# Patient Record
Sex: Male | Born: 1950 | Race: White | Hispanic: No | Marital: Married | Smoking: Former smoker
Health system: Southern US, Community
[De-identification: ages and names within clinical notes are randomized; demographics above are authoritative.]

## PROBLEM LIST (undated history)

## (undated) DIAGNOSIS — J45909 Unspecified asthma, uncomplicated: Secondary | ICD-10-CM

## (undated) DIAGNOSIS — J309 Allergic rhinitis, unspecified: Secondary | ICD-10-CM

## (undated) DIAGNOSIS — K219 Gastro-esophageal reflux disease without esophagitis: Secondary | ICD-10-CM

## (undated) DIAGNOSIS — I4819 Other persistent atrial fibrillation: Secondary | ICD-10-CM

## (undated) HISTORY — PX: TONSILLECTOMY: SUR1361

## (undated) HISTORY — PX: HERNIA REPAIR: SHX51

## (undated) HISTORY — DX: Other persistent atrial fibrillation: I48.19

## (undated) HISTORY — PX: SINOSCOPY: SHX187

## (undated) HISTORY — PX: ADENOIDECTOMY: SUR15

## (undated) HISTORY — DX: Unspecified asthma, uncomplicated: J45.909

## (undated) HISTORY — DX: Allergic rhinitis, unspecified: J30.9

## (undated) HISTORY — PX: COLON RESECTION: SHX5231

## (undated) HISTORY — DX: Gastro-esophageal reflux disease without esophagitis: K21.9

---

## 2015-05-19 ENCOUNTER — Other Ambulatory Visit: Payer: Self-pay | Admitting: *Deleted

## 2015-05-19 MED ORDER — BUDESONIDE-FORMOTEROL FUMARATE 160-4.5 MCG/ACT IN AERO: 2.0000 | INHALATION_SPRAY | Freq: Two times a day (BID) | RESPIRATORY_TRACT | Status: DC

## 2015-07-05 ENCOUNTER — Encounter: Payer: Self-pay | Admitting: Allergy and Immunology

## 2015-07-05 ENCOUNTER — Ambulatory Visit (INDEPENDENT_AMBULATORY_CARE_PROVIDER_SITE_OTHER): Payer: PRIVATE HEALTH INSURANCE | Admitting: Allergy and Immunology

## 2015-07-05 VITALS — BP 124/86 | HR 86 | Resp 20 | Ht 69.0 in | Wt 187.0 lb

## 2015-07-05 DIAGNOSIS — K219 Gastro-esophageal reflux disease without esophagitis: Secondary | ICD-10-CM

## 2015-07-05 DIAGNOSIS — J309 Allergic rhinitis, unspecified: Secondary | ICD-10-CM

## 2015-07-05 DIAGNOSIS — J387 Other diseases of larynx: Secondary | ICD-10-CM | POA: Diagnosis not present

## 2015-07-05 DIAGNOSIS — H1010 Acute atopic conjunctivitis, unspecified eye: Secondary | ICD-10-CM

## 2015-07-05 DIAGNOSIS — J019 Acute sinusitis, unspecified: Secondary | ICD-10-CM

## 2015-07-05 DIAGNOSIS — J4540 Moderate persistent asthma, uncomplicated: Secondary | ICD-10-CM

## 2015-07-05 MED ORDER — AMOXICILLIN-POT CLAVULANATE 875-125 MG PO TABS: 1.0000 | ORAL_TABLET | Freq: Two times a day (BID) | ORAL | Status: DC

## 2015-07-05 MED ORDER — BUDESONIDE-FORMOTEROL FUMARATE 160-4.5 MCG/ACT IN AERO: 2.0000 | INHALATION_SPRAY | Freq: Two times a day (BID) | RESPIRATORY_TRACT | Status: DC

## 2015-07-05 NOTE — Patient Instructions (Addendum)
  1. Augmentin 875 one tablet twice a day for a full 20 days  2. Continue Symbicort 160 2 inhalations twice a day   3. Continue Accolate 20 mg twice a day  4. Continue Nasonex one spray each nostril one-2 times a day  5. Continue omeprazole 20 mg daily  6. If needed: Astelin, Ventolin  7. Further treatment?  8. Return to clinic in 1 year or earlier if problem

## 2015-07-05 NOTE — Progress Notes (Signed)
Follow-up Note  Referring Provider: No ref. provider found Primary Provider: Pcp Not In System Date of Office Visit: 07/05/2015  Subjective:   Ross Robinson. (DOB: 02-18-1951) is a 65 y.o. male [facility] in re-evaluation of the following:  HPI: Ross Robinson returns to this clinic in reevaluation of his asthma and allergic rhinoconjunctivitis and laryngopharyngeal reflux. I've not seen him in his clinic in over one year.  His asthma has been under excellent control while consistently using his Symbicort. He does not use a short acting bronchodilator. He has not required a systemic steroid for an exacerbation of asthma. He doesn't exercise on a regular basis but when mowing the lawn or using a chainsaw he does fine. If he walks up steep hills for a long period in time he sometimes gets winded but his recovery time is minutes.  His upper airway disease has been very good as well. However, most recently he developed an episode of sinusitis treated initially with doxycycline and then subsequent only with a short course of Augmentin for 7 days. Although he is better with his nasal congestion and ear fullness he still has head fullness and pain on his right cheek and ear fullness. He was also given Kenalog without initial doxycycline prescription.  [name] has had excellent control of his reflux while using omeprazole 20 mg daily.    Medication List           azelastine 0.1 % nasal spray  Commonly known as:  ASTELIN  Place 2 sprays into both nostrils 2 (two) times daily. Use in each nostril as directed     budesonide-formoterol 160-4.5 MCG/ACT inhaler  Commonly known as:  SYMBICORT  Inhale 2 puffs into the lungs 2 (two) times daily.     mometasone 50 MCG/ACT nasal spray  Commonly known as:  NASONEX  Place 2 sprays into the nose 2 (two) times daily.     omeprazole 20 MG capsule  Commonly known as:  PRILOSEC  Take 20 mg by mouth  daily.     VALIUM 5 MG tablet  Generic drug:  diazepam  Take 5 mg by mouth every 6 (six) hours as needed for anxiety.     VENTOLIN HFA 108 (90 Base) MCG/ACT inhaler  Generic drug:  albuterol  Inhale 2 puffs into the lungs every 6 (six) hours as needed for wheezing or shortness of breath.     zafirlukast 20 MG tablet  Commonly known as:  ACCOLATE  Take 20 mg by mouth 2 (two) times daily before a meal.        Past Medical History  Diagnosis Date  . Asthma     Past Surgical History  Procedure Laterality Date  . Hernia repair    . Sinoscopy    . Adenoidectomy    . Tonsillectomy      Allergies  Allergen Reactions  . Biaxin [Clarithromycin]   . Ceftin [Cefuroxime Axetil]     Review of systems negative except as noted in HPI / PMHx or noted below:  Review of Systems  Constitutional: Negative.   HENT: Negative.   Eyes: Negative.   Respiratory: Negative.   Cardiovascular: Negative.   Gastrointestinal: Negative.   Genitourinary: Negative.   Musculoskeletal: Negative.   Skin: Negative.   Neurological: Negative.   Endo/Heme/Allergies: Negative.   Psychiatric/Behavioral: Negative.      Objective:   Filed Vitals:   07/05/15 1113  BP: 124/86  Pulse:  86  Resp: 20   Height:  (175.3 cm)  Weight: 187 lb (84.823 kg)   Physical Exam  Constitutional: He is well-developed, well-nourished, and in no distress.  Slight nasal voice  HENT:  Head: Normocephalic.  Right Ear: Tympanic membrane, external ear and ear canal normal.  Left Ear: Tympanic membrane, external ear and ear canal normal.  Nose: Nose normal. No mucosal edema or rhinorrhea.  Mouth/Throat: Uvula is midline, oropharynx is clear and moist and mucous membranes are normal. No oropharyngeal exudate.  Eyes: Conjunctivae are normal.  Neck: Trachea normal. No tracheal tenderness present. No tracheal deviation present. No thyromegaly present.  Cardiovascular: Normal rate, regular rhythm, S1 normal, S2  normal and normal heart sounds.   No murmur heard. Pulmonary/Chest: Breath sounds normal. No stridor. No respiratory distress. He has no wheezes. He has no rales.  Musculoskeletal: He exhibits no edema.  Lymphadenopathy:       Head (right side): No tonsillar adenopathy present.       Head (left side): No tonsillar adenopathy present.    He has no cervical adenopathy.  Neurological: He is alert. Gait normal.  Skin: No rash noted. He is not diaphoretic. No erythema. Nails show no clubbing.  Psychiatric: Mood and affect normal.    Diagnostics:    Spirometry was performed and demonstrated an FEV1 of 3.12 at 93 % of predicted.  The patient had an Asthma Control Test with the following results: ACT Total Score: 22.    Assessment and Plan:   1. Asthma, moderate persistent, well-controlled   2. Allergic rhinoconjunctivitis   3. LPRD (laryngopharyngeal reflux disease)   4. Acute sinusitis, recurrence not specified, unspecified location     1. Augmentin 875 one tablet twice a day for a full 20 days  2. Continue Symbicort 160 2 inhalations twice a day   3. Continue Accolate 20 mg twice a day  4. Continue Nasonex one spray each nostril one-2 times a day  5. Continue omeprazole 20 mg daily  6. If needed: Astelin, Ventolin  7. Further treatment?  8. Return to clinic in 1 year or earlier if problem  Although [name] appears to have pretty good control of his asthma and reflux his head is not doing as well. Given his previous history of chronic sinusitis I am going to treat him with prolonged antibiotic administration using Augmentin for the next 20 days. He will contact me noting his response to this therapy if it is inadequate in alleviating his remaining sinus headache symptoms. I see no need for changing his other medical therapy at this point and we will now see him back in this clinic in 1 year or earlier if there is a problem.  Laurette Schimke, MD [facility]

## 2015-07-07 ENCOUNTER — Telehealth: Payer: Self-pay | Admitting: Allergy and Immunology

## 2015-07-07 ENCOUNTER — Other Ambulatory Visit: Payer: Self-pay | Admitting: *Deleted

## 2015-07-07 MED ORDER — AZELASTINE HCL 0.1 % NA SOLN: NASAL | Status: DC

## 2015-07-07 MED ORDER — ZAFIRLUKAST 20 MG PO TABS: ORAL_TABLET | ORAL | Status: DC

## 2015-07-07 MED ORDER — MOMETASONE FUROATE 50 MCG/ACT NA SUSP: NASAL | Status: DC

## 2015-07-07 NOTE — Telephone Encounter (Signed)
RX SENT TO PHARMACY

## 2015-07-07 NOTE — Telephone Encounter (Signed)
Needs refills on ACCOLATE, NASONEX, ASTELIN.   He was seen on Monday.  [facility]

## 2015-08-05 ENCOUNTER — Telehealth: Payer: Self-pay | Admitting: Allergy and Immunology

## 2015-08-05 ENCOUNTER — Other Ambulatory Visit: Payer: Self-pay | Admitting: *Deleted

## 2015-08-05 MED ORDER — ZAFIRLUKAST 20 MG PO TABS: ORAL_TABLET | ORAL | Status: DC

## 2015-08-05 MED ORDER — BUDESONIDE-FORMOTEROL FUMARATE 160-4.5 MCG/ACT IN AERO: 2.0000 | INHALATION_SPRAY | Freq: Two times a day (BID) | RESPIRATORY_TRACT | Status: DC

## 2015-08-05 MED ORDER — OMEPRAZOLE 20 MG PO CPDR: 20.0000 mg | DELAYED_RELEASE_CAPSULE | Freq: Every day | ORAL | Status: DC

## 2015-08-05 MED ORDER — AZELASTINE HCL 0.1 % NA SOLN: NASAL | Status: DC

## 2015-08-05 MED ORDER — MOMETASONE FUROATE 50 MCG/ACT NA SUSP: NASAL | Status: DC

## 2015-08-05 NOTE — Telephone Encounter (Signed)
Need 90 day refill Tammy took care of this.

## 2015-12-06 ENCOUNTER — Other Ambulatory Visit: Payer: Self-pay

## 2015-12-06 ENCOUNTER — Telehealth: Payer: Self-pay

## 2015-12-06 MED ORDER — PREDNISONE 10 MG PO TABS: ORAL_TABLET | ORAL | 0 refills | Status: DC

## 2015-12-06 MED ORDER — AMOXICILLIN-POT CLAVULANATE 875-125 MG PO TABS: ORAL_TABLET | ORAL | 0 refills | Status: DC

## 2015-12-06 NOTE — Telephone Encounter (Signed)
This weekend patient started with right sided facial pain. His teeth and ear are bothering him as well.  Right side of face is puffy.  He is having some clear nasal drainage. He did take some allergy pills and Chest Congestion PE yesterday.  He is using his medications and wants to know if he should come in to see Dr.Kozlow or if Dr. Lucie Leather can advise him on what he can do to treat his symptoms.  Please advise.

## 2015-12-06 NOTE — Telephone Encounter (Signed)
Please provide patient with Augmentin 875 one tablet twice a day for 14 days and prednisone 10 mg tablet twice a day for 7 days and have him come and see [place] if he is not doing better.

## 2015-12-06 NOTE — Telephone Encounter (Signed)
Called and informed patient of the plan.  ERX sent to [facility].

## 2015-12-13 ENCOUNTER — Ambulatory Visit (INDEPENDENT_AMBULATORY_CARE_PROVIDER_SITE_OTHER): Payer: PRIVATE HEALTH INSURANCE | Admitting: Allergy and Immunology

## 2015-12-13 ENCOUNTER — Encounter: Payer: Self-pay | Admitting: Allergy and Immunology

## 2015-12-13 VITALS — BP 140/82 | HR 68 | Resp 22

## 2015-12-13 DIAGNOSIS — J329 Chronic sinusitis, unspecified: Secondary | ICD-10-CM | POA: Diagnosis not present

## 2015-12-13 DIAGNOSIS — L03213 Periorbital cellulitis: Secondary | ICD-10-CM

## 2015-12-13 DIAGNOSIS — K219 Gastro-esophageal reflux disease without esophagitis: Secondary | ICD-10-CM

## 2015-12-13 DIAGNOSIS — J3089 Other allergic rhinitis: Secondary | ICD-10-CM | POA: Diagnosis not present

## 2015-12-13 DIAGNOSIS — J4540 Moderate persistent asthma, uncomplicated: Secondary | ICD-10-CM | POA: Diagnosis not present

## 2015-12-13 MED ORDER — CLINDAMYCIN HCL 300 MG PO CAPS: 300.0000 mg | ORAL_CAPSULE | Freq: Three times a day (TID) | ORAL | 0 refills | Status: DC

## 2015-12-13 MED ORDER — TRAMADOL HCL 50 MG PO TABS: ORAL_TABLET | ORAL | 0 refills | Status: DC

## 2015-12-13 NOTE — Progress Notes (Signed)
Follow-up Note  Referring Provider: No ref. provider found Primary Provider: Pcp Not In System Date of Office Visit: 12/13/2015  Subjective:   Ross Robinson. (DOB: Nov 17, 1950) is a 65 y.o. male who [facility]2015 in re-evaluation of the following:  HPI: Ross Robinson presents to this clinic in evaluation of right sided face pain and swelling that appeared to develop mid October for which he contacted me by telephone and I gave him Augmentin 875 one tablet twice a day for 14 days and prednisone 20 mg daily for 7 days. He just finished his prednisone yesterday. He is worse. He is now having a lot of swelling around his right eye and is taking ibuprofen because of the pain. He's not really having any ugly nasal discharge or anosmia or fever. He has not had any involvement of his asthma. His reflux is under excellent control. He has had a history of several functional endoscopic sinus surgeries performed on his sinus cavities in the past. His asthma has been stable as has his reflux disease.    Medication List      amoxicillin-clavulanate 875-125 MG tablet Commonly known as:  AUGMENTIN Take one tablet by mouth twice a day for fourteen days.   azelastine 0.1 % nasal spray Commonly known as:  ASTELIN USE TWO SPRAYS IN EACH NOSTRIL TWICE DAILY IF NEEDED   budesonide-formoterol 160-4.5 MCG/ACT inhaler Commonly known as:  SYMBICORT Inhale 2 puffs into the lungs 2 (two) times daily.   mometasone 50 MCG/ACT nasal spray Commonly known as:  NASONEX USE TWO SPRAYS IN EACH NOSTRIL ONCE OR TWICE DAILY   omeprazole 20 MG capsule Commonly known as:  PRILOSEC Take 1 capsule (20 mg total) by mouth daily.   VALIUM 5 MG tablet Generic drug:  diazepam Take 5 mg by mouth every 6 (six) hours as needed for anxiety.   VENTOLIN HFA 108 (90 Base) MCG/ACT inhaler Generic drug:  albuterol Inhale 2 puffs into the lungs every 6 (six) hours as needed for wheezing  or shortness of breath.   zafirlukast 20 MG tablet Commonly known as:  ACCOLATE TAKE ONE TABLET TWICE DAILY       Past Medical History:  Diagnosis Date  . Asthma     Past Surgical History:  Procedure Laterality Date  . ADENOIDECTOMY    . HERNIA REPAIR    . SINOSCOPY    . TONSILLECTOMY      Allergies  Allergen Reactions  . Biaxin [Clarithromycin]   . Ceftin [Cefuroxime Axetil]   . Codeine Nausea And Vomiting    Review of systems negative except as noted in HPI / PMHx or noted below:  Review of Systems  Constitutional: Negative.   HENT: Negative.   Eyes: Negative.   Respiratory: Negative.   Cardiovascular: Negative.   Gastrointestinal: Negative.   Genitourinary: Negative.   Musculoskeletal: Negative.   Skin: Negative.   Neurological: Negative.   Endo/Heme/Allergies: Negative.   Psychiatric/Behavioral: Negative.      Objective:   Vitals:   12/13/15 0916  BP: 140/82  Pulse: 68  Resp: (!) 22          Physical Exam  Constitutional: He is well-developed, well-nourished, and in no distress.  HENT:  Head: Normocephalic.  Right Ear: Tympanic membrane, external ear and ear canal normal.  Left Ear: Tympanic membrane, external ear and ear canal normal.  Nose: Nose normal. No mucosal edema or rhinorrhea.  Mouth/Throat: Uvula is midline, oropharynx  is clear and moist and mucous membranes are normal. No oropharyngeal exudate.  Right periorbital swelling significant infraorbital purple hue without any obvious tenderness.  Eyes: Conjunctivae are normal.  Neck: Trachea normal. No tracheal tenderness present. No tracheal deviation present. No thyromegaly present.  Cardiovascular: Normal rate, regular rhythm, S1 normal, S2 normal and normal heart sounds.   No murmur heard. Pulmonary/Chest: Breath sounds normal. No stridor. No respiratory distress. He has no wheezes. He has no rales.  Musculoskeletal: He exhibits no edema.  Lymphadenopathy:       Head (right side):  No tonsillar adenopathy present.       Head (left side): No tonsillar adenopathy present.    He has no cervical adenopathy.  Neurological: He is alert. Gait normal.  Skin: No rash noted. He is not diaphoretic. No erythema. Nails show no clubbing.  Psychiatric: Mood and affect normal.    Diagnostics:    Spirometry was performed and demonstrated an FEV1 of 2.84 at 84 % of predicted.  The patient had an Asthma Control Test with the following results: ACT Total Score: 25.    Assessment and Plan:   1. Asthma, moderate persistent, well-controlled   2. Other allergic rhinitis   3. LPRD (laryngopharyngeal reflux disease)   4. Chronic sinusitis, unspecified location   5. Periorbital cellulitis of right eye     1. Clindamycin 300 mg tablet 3 times a day for 14 days  2. Continue Symbicort 160 2 inhalations twice a day   3. Continue Accolate 20 mg twice a day  4. Continue Nasonex one spray each nostril one-2 times a day  5. Continue omeprazole 20 mg daily  6. If needed: Astelin, Ventolin  7. Sinus CT scan today in evaluation of possible periorbital cellulitis  8. Can add Tramadol 25mg  one tablet every 6 hours if needed for pain, #20  9. Further evaluation and treatment?  I think we are obligated to rule out periorbital cellulitis and I will obtain a CT scan of Ross Robinson sinuses today and start him on broad-spectrum antibiotics including clindamycin 900 mg daily. If he does have evidence of periorbital cellulitis we'll have him see an ENT physician ASAP. He'll continue on all of his anti-inflammatory medications as noted above and I have given him some tramadol to help with pain.  Laurette SchimkeEric Kozlow, MD [facility]

## 2015-12-13 NOTE — Patient Instructions (Addendum)
  1. Clindamycin 300 mg tablet 3 times a day for 14 days  2. Continue Symbicort 160 2 inhalations twice a day   3. Continue Accolate 20 mg twice a day  4. Continue Nasonex one spray each nostril one-2 times a day  5. Continue omeprazole 20 mg daily  6. If needed: Astelin, Ventolin  7. Sinus CT scan today in evaluation of possible periorbital cellulitis  8. Can add Tramadol 25mg  one tablet every 6 hours if needed for pain, #20  9. Further evaluation and treatment?

## 2015-12-14 ENCOUNTER — Telehealth: Payer: Self-pay | Admitting: Allergy and Immunology

## 2015-12-14 NOTE — Telephone Encounter (Signed)
He has an appt at [facility] at 9:20am on Wednesday.

## 2015-12-14 NOTE — Telephone Encounter (Signed)
Please see if we can get rentals into see Dr. Marcheta Grammes or PA over at [facility] ear nose and throat today or tomorrow and let minerals no that we will attempt to get this done.

## 2015-12-14 NOTE — Telephone Encounter (Signed)
Still having some swelling in face and having a lot of pressure. Has taken 4 doses of medicine that you prescribed yesterday.   Please Advise.

## 2015-12-15 ENCOUNTER — Encounter: Payer: Self-pay | Admitting: Allergy and Immunology

## 2016-05-03 ENCOUNTER — Other Ambulatory Visit: Payer: Self-pay | Admitting: *Deleted

## 2016-05-03 MED ORDER — ZAFIRLUKAST 20 MG PO TABS: ORAL_TABLET | ORAL | 0 refills | Status: DC

## 2016-05-03 MED ORDER — OMEPRAZOLE 20 MG PO CPDR: DELAYED_RELEASE_CAPSULE | ORAL | 0 refills | Status: DC

## 2016-06-29 ENCOUNTER — Ambulatory Visit (INDEPENDENT_AMBULATORY_CARE_PROVIDER_SITE_OTHER): Payer: PRIVATE HEALTH INSURANCE | Admitting: Allergy and Immunology

## 2016-06-29 ENCOUNTER — Encounter: Payer: Self-pay | Admitting: Allergy and Immunology

## 2016-06-29 VITALS — BP 124/90 | HR 72 | Resp 20

## 2016-06-29 DIAGNOSIS — J4540 Moderate persistent asthma, uncomplicated: Secondary | ICD-10-CM

## 2016-06-29 DIAGNOSIS — K219 Gastro-esophageal reflux disease without esophagitis: Secondary | ICD-10-CM

## 2016-06-29 DIAGNOSIS — J3089 Other allergic rhinitis: Secondary | ICD-10-CM

## 2016-06-29 MED ORDER — BUDESONIDE-FORMOTEROL FUMARATE 160-4.5 MCG/ACT IN AERO: 2.0000 | INHALATION_SPRAY | Freq: Two times a day (BID) | RESPIRATORY_TRACT | 3 refills | Status: DC

## 2016-06-29 MED ORDER — AZELASTINE HCL 0.1 % NA SOLN: NASAL | 3 refills | Status: DC

## 2016-06-29 MED ORDER — ZAFIRLUKAST 20 MG PO TABS: ORAL_TABLET | ORAL | 3 refills | Status: DC

## 2016-06-29 MED ORDER — OMEPRAZOLE 20 MG PO CPDR: DELAYED_RELEASE_CAPSULE | ORAL | 3 refills | Status: DC

## 2016-06-29 MED ORDER — MOMETASONE FUROATE 50 MCG/ACT NA SUSP: NASAL | 3 refills | Status: DC

## 2016-06-29 MED ORDER — ALBUTEROL SULFATE HFA 108 (90 BASE) MCG/ACT IN AERS: 2.0000 | INHALATION_SPRAY | Freq: Four times a day (QID) | RESPIRATORY_TRACT | 1 refills | Status: DC | PRN

## 2016-06-29 NOTE — Progress Notes (Signed)
Follow-up Note  Referring Provider: No ref. provider found Primary Provider: Patient, No Pcp Per Date of Office Visit: 06/29/2016  Subjective:   Ross Robinson. (DOB: [birth year]) is a 66 y.o. male who [facility] 06[facility]29/2016 in re-evaluation of the following:  HPI: Ross Robinson returns to this clinic in reevaluation of his asthma and allergic rhinoconjunctivitis and history of chronic sinusitis and reflux-induced respiratory disease. I last saw him in this clinic in October 2017. At that point time he appeared to have a rather significant episode of sinusitis with possible periorbital involvement for which he was treated with high-dose clindamycin.  He did well with that last episode and has not had any sinus problem since. He continues to use Nasonex on a pretty regular basis.  He's had no problems with his asthma requiring him to get a systemic steroid. He rarely uses a short acting bronchodilator and he can exercise without any problem. He continues on Symbicort and Accolate.  Reflux has been under excellent control while consistently using omeprazole.  He did obtain the flu vaccine this year.  Allergies as of 06/29/2016      Reactions   Biaxin [clarithromycin]    Ceftin [cefuroxime Axetil]    Codeine Nausea And Vomiting      Medication List      azelastine 0.1 % nasal spray Commonly known as:  ASTELIN USE TWO SPRAYS IN EACH NOSTRIL TWICE DAILY IF NEEDED   budesonide-formoterol 160-4.5 MCG/ACT inhaler Commonly known as:  SYMBICORT Inhale 2 puffs into the lungs 2 (two) times daily.   mometasone 50 MCG/ACT nasal spray Commonly known as:  NASONEX USE TWO SPRAYS IN EACH NOSTRIL ONCE OR TWICE DAILY   omeprazole 20 MG capsule Commonly known as:  PRILOSEC Take one capsule once daily   VALIUM 5 MG tablet Generic drug:  diazepam Take 5 mg by mouth every 6 (six) hours as needed for anxiety.   VENTOLIN HFA 108 (90 Base) MCG/ACT  inhaler Generic drug:  albuterol Inhale 2 puffs into the lungs every 6 (six) hours as needed for wheezing or shortness of breath.   zafirlukast 20 MG tablet Commonly known as:  ACCOLATE TAKE ONE TABLET TWICE DAILY       Past Medical History:  Diagnosis Date  . Asthma     Past Surgical History:  Procedure Laterality Date  . ADENOIDECTOMY    . HERNIA REPAIR    . SINOSCOPY    . TONSILLECTOMY      Review of systems negative except as noted in HPI / PMHx or noted below:  Review of Systems  Constitutional: Negative.   HENT: Negative.   Eyes: Negative.   Respiratory: Negative.   Cardiovascular: Negative.   Gastrointestinal: Negative.   Genitourinary: Negative.   Musculoskeletal: Negative.   Skin: Negative.   Neurological: Negative.   Endo/Heme/Allergies: Negative.   Psychiatric/Behavioral: Negative.      Objective:   Vitals:   06/29/16 1521  BP: 124/90  Pulse: 72  Resp: 20          Physical Exam  Constitutional: He is well-developed, well-nourished, and in no distress.  HENT:  Head: Normocephalic.  Right Ear: Tympanic membrane, external ear and ear canal normal.  Left Ear: Tympanic membrane, external ear and ear canal normal.  Nose: Nose normal. No mucosal edema or rhinorrhea.  Mouth/Throat: Uvula is midline, oropharynx is clear and moist and mucous membranes are normal. No oropharyngeal exudate.  Eyes: Conjunctivae are  normal.  Neck: Trachea normal. No tracheal tenderness present. No tracheal deviation present. No thyromegaly present.  Cardiovascular: Normal rate, regular rhythm, S1 normal, S2 normal and normal heart sounds.   No murmur heard. Pulmonary/Chest: Breath sounds normal. No stridor. No respiratory distress. He has no wheezes. He has no rales.  Musculoskeletal: He exhibits no edema.  Lymphadenopathy:       Head (right side): No tonsillar adenopathy present.       Head (left side): No tonsillar adenopathy present.    He has no cervical  adenopathy.  Neurological: He is alert. Gait normal.  Skin: No rash noted. He is not diaphoretic. No erythema. Nails show no clubbing.  Psychiatric: Mood and affect normal.    Diagnostics: Results of a sinus CT scan obtained 12/13/2015 identified mucosal thickening of both maxillary sinuses.   Spirometry was performed and demonstrated an FEV1 of 3.05 at 92 % of predicted.  Assessment and Plan:   1. Asthma, moderate persistent, well-controlled   2. Other allergic rhinitis   3. LPRD (laryngopharyngeal reflux disease)     1. Continue Symbicort 160 2 inhalations twice a day   2. Continue Accolate 20 mg twice a day  3. Continue Nasonex one spray each nostril one-2 times a day  4. Continue omeprazole 20 mg daily  5. If needed: Astelin, Ventolin HFA  6.  Return to clinic in 6 months or earlier if problem  Ross Robinson appears to be doing relatively well at this point in time and I will now see him back in this clinic in 6 months or earlier if there is a problem. He will remain on anti-inflammatory agents for both his upper and lower airways and continue to treat reflux as noted above. He will contact me during the interval should there be a problem.  Laurette Schimke, MD Allergy / Immunology [facility]

## 2016-06-29 NOTE — Patient Instructions (Addendum)
  1. Continue Symbicort 160 2 inhalations twice a day   2. Continue Accolate 20 mg twice a day  3. Continue Nasonex one spray each nostril one-2 times a day  4. Continue omeprazole 20 mg daily  5. If needed: Astelin, Ventolin HFA  6.  Return to clinic in 6 months or earlier if problem

## 2016-07-03 ENCOUNTER — Encounter: Payer: Self-pay | Admitting: Allergy and Immunology

## 2016-12-20 ENCOUNTER — Encounter: Payer: Self-pay | Admitting: Allergy and Immunology

## 2016-12-20 ENCOUNTER — Ambulatory Visit (INDEPENDENT_AMBULATORY_CARE_PROVIDER_SITE_OTHER): Payer: PRIVATE HEALTH INSURANCE | Admitting: Allergy and Immunology

## 2016-12-20 VITALS — BP 142/92 | HR 68 | Resp 18

## 2016-12-20 DIAGNOSIS — K219 Gastro-esophageal reflux disease without esophagitis: Secondary | ICD-10-CM | POA: Diagnosis not present

## 2016-12-20 DIAGNOSIS — J3089 Other allergic rhinitis: Secondary | ICD-10-CM | POA: Diagnosis not present

## 2016-12-20 DIAGNOSIS — J4540 Moderate persistent asthma, uncomplicated: Secondary | ICD-10-CM

## 2016-12-20 NOTE — Patient Instructions (Addendum)
  1. Continue Symbicort 160 2 inhalations twice a day   2. Add QVAR 40 Redihaler 2 inhalation two times per day while 'Sick"  3. Continue Accolate 20 mg twice a day  4. Continue Nasonex one spray each nostril one-2 times a day  5. Continue omeprazole 20 mg daily  6. If needed:    C. Astelin   B. Ventolin HFA  C. Mucinex DM  7.  Return to clinic in 6 months or earlier if problem

## 2016-12-20 NOTE — Progress Notes (Signed)
Follow-up Note  Referring Provider: No ref. provider found Primary Provider: Patient, No Pcp Per Date of Office Visit: 12/20/2016  Subjective:   Ross PeterRyan R Goto Jr. (DOB: Jun 15, 1950) is a 66 y.o. male [facility] in re-evaluation of the following:  HPI: Ross RangerReynolds returns to this clinic in reevaluation of his asthma and allergic rhinoconjunctivitis and history of chronic sinusitis and reflux-induced respiratory disease. His last visit to this clinic was 06/29/2016.  Overall he has done wonderful and has not required a systemic steroid or an antibiotic to treat any type of respiratory tract issue and rarely uses any short acting bronchodilator and can exercise without any problem. Likewise, he has had very little issues with his nose or his reflux.  Unfortunately, over the course the past 24 hours he has developed a slight bit of chest congestion and a little bit of cough and just feels tired and run down. He does not have any fever. Some of the material he is coughing up may be slightly tinged in color. He has not been having any issues with his head.  He did receive the flu vaccine.  Allergies as of 12/20/2016      Reactions   Biaxin [clarithromycin]    Ceftin [cefuroxime Axetil]    Codeine Nausea And Vomiting      Medication List      albuterol 108 (90 Base) MCG/ACT inhaler Commonly known as:  VENTOLIN HFA Inhale 2 puffs into the lungs every 6 (six) hours as needed for wheezing or shortness of breath.   azelastine 0.1 % nasal spray Commonly known as:  ASTELIN USE TWO SPRAYS IN EACH NOSTRIL TWICE DAILY IF NEEDED   budesonide-formoterol 160-4.5 MCG/ACT inhaler Commonly known as:  SYMBICORT Inhale 2 puffs into the lungs 2 (two) times daily.   mometasone 50 MCG/ACT nasal spray Commonly known as:  NASONEX USE TWO SPRAYS IN EACH NOSTRIL ONCE OR TWICE DAILY   omeprazole 20 MG capsule Commonly known as:  PRILOSEC Take one  capsule once daily   VALIUM 5 MG tablet Generic drug:  diazepam Take 5 mg by mouth every 6 (six) hours as needed for anxiety.   zafirlukast 20 MG tablet Commonly known as:  ACCOLATE TAKE ONE TABLET TWICE DAILY       Past Medical History:  Diagnosis Date  . Asthma     Past Surgical History:  Procedure Laterality Date  . ADENOIDECTOMY    . HERNIA REPAIR    . SINOSCOPY    . TONSILLECTOMY      Review of systems negative except as noted in HPI / PMHx or noted below:  Review of Systems  Constitutional: Negative.   HENT: Negative.   Eyes: Negative.   Respiratory: Negative.   Cardiovascular: Negative.   Gastrointestinal: Negative.   Genitourinary: Negative.   Musculoskeletal: Negative.   Skin: Negative.   Neurological: Negative.   Endo/Heme/Allergies: Negative.   Psychiatric/Behavioral: Negative.      Objective:   Vitals:   12/20/16 1514  BP: (!) 142/92  Pulse: 68  Resp: 18  SpO2: 95%          Physical Exam  Constitutional: He is well-developed, well-nourished, and in no distress.  HENT:  Head: Normocephalic.  Right Ear: Tympanic membrane, external ear and ear canal normal.  Left Ear: Tympanic membrane, external ear and ear canal normal.  Nose: Nose normal. No mucosal edema or rhinorrhea.  Mouth/Throat: Uvula is midline, oropharynx is clear  and moist and mucous membranes are normal. No oropharyngeal exudate.  Eyes: Conjunctivae are normal.  Neck: Trachea normal. No tracheal tenderness present. No tracheal deviation present. No thyromegaly present.  Cardiovascular: Normal rate, regular rhythm, S1 normal, S2 normal and normal heart sounds.   No murmur heard. Pulmonary/Chest: Breath sounds normal. No stridor. No respiratory distress. He has no wheezes. He has no rales.  Musculoskeletal: He exhibits no edema.  Lymphadenopathy:       Head (right side): No tonsillar adenopathy present.       Head (left side): No tonsillar adenopathy present.    He has no  cervical adenopathy.  Neurological: He is alert. Gait normal.  Skin: No rash noted. He is not diaphoretic. No erythema. Nails show no clubbing.  Psychiatric: Mood and affect normal.    Diagnostics:    Spirometry was performed and demonstrated an FEV1 of 2.76 at 83 % of predicted.  The patient had an Asthma Control Test with the following results: ACT Total Score: 24.    Assessment and Plan:   1. Asthma, moderate persistent, well-controlled   2. Other allergic rhinitis   3. LPRD (laryngopharyngeal reflux disease)     1. Continue Symbicort 160 2 inhalations twice a day   2. Add QVAR 40 Redihaler 2 inhalation two times per day while 'Sick"  3. Continue Accolate 20 mg twice a day  4. Continue Nasonex one spray each nostril one-2 times a day  5. Continue omeprazole 20 mg daily  6. If needed:    C. Astelin   B. Ventolin HFA  C. Mucinex DM  7.  Return to clinic in 6 months or earlier if problem  Ross Robinson appears to be doing relatively well although he may have contracted a viral respiratory tract infection over the course of the past 24 hours and we will treat him conservatively by having him increase his dose of ICS during this timeframe. If he continues to do well I will see him back in this clinic in 6 months. He will contact me during the interval should there be a problem.  Laurette Schimke, MD Allergy / Immunology [facility]

## 2017-02-07 ENCOUNTER — Other Ambulatory Visit: Payer: Self-pay

## 2017-02-07 MED ORDER — ALBUTEROL SULFATE HFA 108 (90 BASE) MCG/ACT IN AERS: 2.0000 | INHALATION_SPRAY | Freq: Four times a day (QID) | RESPIRATORY_TRACT | 1 refills | Status: DC | PRN

## 2017-07-09 ENCOUNTER — Other Ambulatory Visit: Payer: Self-pay | Admitting: *Deleted

## 2017-07-09 MED ORDER — MOMETASONE FUROATE 50 MCG/ACT NA SUSP: NASAL | 0 refills | Status: DC

## 2017-08-02 ENCOUNTER — Ambulatory Visit (INDEPENDENT_AMBULATORY_CARE_PROVIDER_SITE_OTHER): Payer: Medicare Other | Admitting: Allergy and Immunology

## 2017-08-02 ENCOUNTER — Encounter: Payer: Self-pay | Admitting: Allergy and Immunology

## 2017-08-02 ENCOUNTER — Other Ambulatory Visit: Payer: Self-pay | Admitting: *Deleted

## 2017-08-02 VITALS — BP 142/96 | HR 64 | Resp 18

## 2017-08-02 DIAGNOSIS — H9312 Tinnitus, left ear: Secondary | ICD-10-CM

## 2017-08-02 DIAGNOSIS — K648 Other hemorrhoids: Secondary | ICD-10-CM | POA: Diagnosis not present

## 2017-08-02 DIAGNOSIS — I83893 Varicose veins of bilateral lower extremities with other complications: Secondary | ICD-10-CM

## 2017-08-02 DIAGNOSIS — K219 Gastro-esophageal reflux disease without esophagitis: Secondary | ICD-10-CM | POA: Diagnosis not present

## 2017-08-02 DIAGNOSIS — J4540 Moderate persistent asthma, uncomplicated: Secondary | ICD-10-CM | POA: Diagnosis not present

## 2017-08-02 DIAGNOSIS — J3089 Other allergic rhinitis: Secondary | ICD-10-CM

## 2017-08-02 MED ORDER — BECLOMETHASONE DIPROP HFA 40 MCG/ACT IN AERB: INHALATION_SPRAY | RESPIRATORY_TRACT | 3 refills | Status: DC

## 2017-08-02 MED ORDER — OMEPRAZOLE 20 MG PO CPDR: DELAYED_RELEASE_CAPSULE | ORAL | 1 refills | Status: DC

## 2017-08-02 MED ORDER — ZAFIRLUKAST 20 MG PO TABS: ORAL_TABLET | ORAL | 1 refills | Status: DC

## 2017-08-02 MED ORDER — BUDESONIDE-FORMOTEROL FUMARATE 160-4.5 MCG/ACT IN AERO: INHALATION_SPRAY | RESPIRATORY_TRACT | 1 refills | Status: DC

## 2017-08-02 MED ORDER — AZELASTINE HCL 0.1 % NA SOLN: NASAL | 1 refills | Status: DC

## 2017-08-02 MED ORDER — ALBUTEROL SULFATE HFA 108 (90 BASE) MCG/ACT IN AERS: INHALATION_SPRAY | RESPIRATORY_TRACT | 1 refills | Status: DC

## 2017-08-02 MED ORDER — MOMETASONE FUROATE 50 MCG/ACT NA SUSP: NASAL | 1 refills | Status: DC

## 2017-08-02 MED ORDER — ZAFIRLUKAST 20 MG PO TABS: ORAL_TABLET | ORAL | 0 refills | Status: DC

## 2017-08-02 MED ORDER — OMEPRAZOLE 20 MG PO CPDR: DELAYED_RELEASE_CAPSULE | ORAL | 0 refills | Status: DC

## 2017-08-02 MED ORDER — BUDESONIDE-FORMOTEROL FUMARATE 160-4.5 MCG/ACT IN AERO: INHALATION_SPRAY | RESPIRATORY_TRACT | 0 refills | Status: DC

## 2017-08-02 NOTE — Patient Instructions (Addendum)
  1. Continue Symbicort 160 2 inhalations twice a day   2. Add QVAR 40 Redihaler 2 inhalation two times per day while 'Sick"  3. Continue Accolate 20 mg twice a day  4. Continue Nasonex one spray each nostril one-2 times a day  5. Continue omeprazole 20 mg daily  6. If needed:    C. Astelin   B. Ventolin HFA  C. Mucinex DM  7. Dr. Belva Crome for varicose veins, Dr. Logan Bores for hemorrhoids, Dr. Marcheta Grammes for tinnitus  8.  Return to clinic in 6 months or earlier if problem  9.  Obtain fall flu vaccine

## 2017-08-02 NOTE — Progress Notes (Signed)
Follow-up Note  Referring Provider: No ref. provider found Primary Provider: Patient, No Pcp Per Date of Office Visit: 08/02/2017  Subjective:   Ross Robinson. (DOB: Mar 30, 1950) is a 67 y.o. male who [facility] 08[facility]2017 in re-evaluation of the following:  HPI: Ross Robinson returns to this clinic in reevaluation of asthma and allergic rhinoconjunctivitis and history of chronic sinusitis and reflux induced respiratory disease.  His last visit to this clinic was 20 December 2016.  He has not required a systemic steroid or an antibiotic to treat any type of respiratory tract issue.  He can exercise with very little problem and rarely uses a short acting bronchodilator except around the time of exercise.  He continues to use a controller agent on a regular basis including the use of a combination inhaler and a leukotriene modifier and he has not had to activate his action plan which includes adding a higher dose of inhaled steroids.  His reflux is under good control with omeprazole.  He does have several other issues that he mentioned today.  He has had a problem with hemorrhoids that have not responded to conservative therapy..  He had a problem with varicose veins.  He has had a tinnitus manifested as something other than ringing affecting his left ear greater than his right ear for many years.  He has no associated vertigo or hearing loss.  Allergies as of 08/02/2017      Reactions   Biaxin [clarithromycin]    Ceftin [cefuroxime Axetil]    Codeine Nausea And Vomiting      Medication List      albuterol 108 (90 Base) MCG/ACT inhaler Commonly known as:  VENTOLIN HFA Inhale 2 puffs into the lungs every 6 (six) hours as needed for wheezing or shortness of breath.   azelastine 0.1 % nasal spray Commonly known as:  ASTELIN USE TWO SPRAYS IN EACH NOSTRIL TWICE DAILY IF NEEDED   budesonide-formoterol 160-4.5 MCG/ACT inhaler Commonly known as:   SYMBICORT Inhale two puffs twice daily to prevent cough or wheeze. Rinse mouth after use.   mometasone 50 MCG/ACT nasal spray Commonly known as:  NASONEX USE TWO SPRAYS IN EACH NOSTRIL ONCE OR TWICE DAILY   omeprazole 20 MG capsule Commonly known as:  PRILOSEC Take one capsule once daily   VALIUM 5 MG tablet Generic drug:  diazepam Take 5 mg by mouth every 6 (six) hours as needed for anxiety.   zafirlukast 20 MG tablet Commonly known as:  ACCOLATE TAKE ONE TABLET TWICE DAILY       Past Medical History:  Diagnosis Date  . Allergic rhinitis   . Asthma   . LPRD (laryngopharyngeal reflux disease)     Past Surgical History:  Procedure Laterality Date  . ADENOIDECTOMY    . HERNIA REPAIR    . SINOSCOPY    . TONSILLECTOMY      Review of systems negative except as noted in HPI / PMHx or noted below:  Review of Systems  Constitutional: Negative.   HENT: Negative.   Eyes: Negative.   Respiratory: Negative.   Cardiovascular: Negative.   Gastrointestinal: Negative.   Genitourinary: Negative.   Musculoskeletal: Negative.   Skin: Negative.   Neurological: Negative.   Endo/Heme/Allergies: Negative.   Psychiatric/Behavioral: Negative.      Objective:   Vitals:   08/02/17 1544  BP: (!) 142/96  Pulse: 64  Resp: 18  Physical Exam  HENT:  Head: Normocephalic.  Right Ear: Tympanic membrane, external ear and ear canal normal.  Left Ear: Tympanic membrane, external ear and ear canal normal.  Nose: Nose normal. No mucosal edema or rhinorrhea.  Mouth/Throat: Uvula is midline, oropharynx is clear and moist and mucous membranes are normal. No oropharyngeal exudate.  Eyes: Conjunctivae are normal.  Neck: Trachea normal. No tracheal tenderness present. No tracheal deviation present. No thyromegaly present.  Cardiovascular: Normal rate, regular rhythm, S1 normal, S2 normal and normal heart sounds.  No murmur heard. Pulmonary/Chest: No stridor. No respiratory  distress. He has wheezes (End expiratory wheezes posterior lung fields). He has no rales.  Musculoskeletal: He exhibits no edema.  Lymphadenopathy:       Head (right side): No tonsillar adenopathy present.       Head (left side): No tonsillar adenopathy present.    He has no cervical adenopathy.  Neurological: He is alert.  Skin: No rash noted. He is not diaphoretic. No erythema. Nails show no clubbing.    Diagnostics:    Spirometry was performed and demonstrated an FEV1 of 2.84 at 86 % of predicted.  The patient had an Asthma Control Test with the following results: ACT Total Score: 23.    Assessment and Plan:   1. Asthma, moderate persistent, well-controlled   2. Other allergic rhinitis   3. LPRD (laryngopharyngeal reflux disease)   4. Varicose veins of bilateral lower extremities with other complications   5. Other hemorrhoids   6. Tinnitus of left ear     1. Continue Symbicort 160 2 inhalations twice a day   2. Add QVAR 40 Redihaler 2 inhalation two times per day while 'Sick"  3. Continue Accolate 20 mg twice a day  4. Continue Nasonex one spray each nostril one-2 times a day  5. Continue omeprazole 20 mg daily  6. If needed:    C. Astelin   B. Ventolin HFA  C. Mucinex DM  7. Dr. Belva Crome for varicose veins, Dr. Logan Bores for hemorrhoids, Dr. Marcheta Grammes for tinnitus  8.  Return to clinic in 6 months or earlier if problem  9.  Obtain fall flu vaccine  [name] appears to be doing quite well on his current therapy and he will continue to utilize a collection of anti-inflammatory agents for his respiratory tract and therapy directed against reflux and I will see him back in his clinic in 6 months or earlier if there is a problem.  I have given him the names of local doctors who can address his other issues as noted above.  Laurette Schimke, MD Allergy / Immunology [facility]

## 2017-08-06 ENCOUNTER — Encounter: Payer: Self-pay | Admitting: Allergy and Immunology

## 2017-12-20 ENCOUNTER — Other Ambulatory Visit: Payer: Self-pay | Admitting: Allergy and Immunology

## 2018-01-23 ENCOUNTER — Ambulatory Visit (INDEPENDENT_AMBULATORY_CARE_PROVIDER_SITE_OTHER): Payer: Medicare Other | Admitting: Allergy and Immunology

## 2018-01-23 VITALS — BP 122/82 | HR 80 | Resp 16

## 2018-01-23 DIAGNOSIS — J4540 Moderate persistent asthma, uncomplicated: Secondary | ICD-10-CM

## 2018-01-23 DIAGNOSIS — J3089 Other allergic rhinitis: Secondary | ICD-10-CM

## 2018-01-23 DIAGNOSIS — K219 Gastro-esophageal reflux disease without esophagitis: Secondary | ICD-10-CM

## 2018-01-23 MED ORDER — MOMETASONE FUROATE 50 MCG/ACT NA SUSP: NASAL | 1 refills | Status: DC

## 2018-01-23 MED ORDER — BUDESONIDE-FORMOTEROL FUMARATE 160-4.5 MCG/ACT IN AERO: INHALATION_SPRAY | RESPIRATORY_TRACT | 1 refills | Status: DC

## 2018-01-23 MED ORDER — OMEPRAZOLE 20 MG PO CPDR: DELAYED_RELEASE_CAPSULE | ORAL | 1 refills | Status: DC

## 2018-01-23 MED ORDER — ALBUTEROL SULFATE HFA 108 (90 BASE) MCG/ACT IN AERS: INHALATION_SPRAY | RESPIRATORY_TRACT | 1 refills | Status: DC

## 2018-01-23 MED ORDER — ZAFIRLUKAST 20 MG PO TABS: ORAL_TABLET | ORAL | 1 refills | Status: DC

## 2018-01-23 MED ORDER — AZELASTINE HCL 0.1 % NA SOLN: NASAL | 1 refills | Status: DC

## 2018-01-23 NOTE — Progress Notes (Signed)
Follow-up Note  Referring Provider: No ref. provider found Primary Provider: Patient, No Pcp Per Date of Office Visit: 01/23/2018  Subjective:   Ross Robinson. (DOB: 07-21-1950) is a 67 y.o. male who [facility] 01[facility]2018 in re-evaluation of the following:  HPI: Ross Robinson returns to this clinic in reevaluation of asthma and allergic rhinoconjunctivitis and a history of chronic sinusitis and LPR.  His last visit to this clinic was 02 August 2017.  He has really done quite well on his current plan without any significant requirement for short acting bronchodilator and without the use of a systemic steroid or antibiotic to treat any type of respiratory tract issue while consistently using Symbicort and Accolate and Nasonex.  However, about 1 week prior to Thanksgiving he did develop aching and chills and fever and some cough and some green sputum production and went to the urgent care center at [facility] and was treated with doxycycline and prednisone with resolution of this issue.  He still somewhat fatigued but otherwise has resolved the bulk of his respiratory tract symptoms.  His reflux is under very good control at this point in time.  He did obtain a flu vaccine this year.  Allergies as of 01/23/2018      Reactions   Biaxin [clarithromycin]    Ceftin [cefuroxime Axetil]    Codeine Nausea And Vomiting      Medication List      albuterol 108 (90 Base) MCG/ACT inhaler Commonly known as:  PROVENTIL HFA;VENTOLIN HFA Inhale two puffs every four to six hours as needed for cough or wheeze.   azelastine 0.1 % nasal spray Commonly known as:  ASTELIN USE TWO SPRAYS IN EACH NOSTRIL TWICE DAILY IF NEEDED   beclomethasone 40 MCG/ACT inhaler Commonly known as:  QVAR Inhale two doses twice daily during asthma flare-up.  Rinse, gargle, and spit after use.   budesonide-formoterol 160-4.5 MCG/ACT inhaler Commonly known as:  SYMBICORT Inhale two puffs  twice daily to prevent cough or wheeze. Rinse mouth after use.   doxycycline 100 MG capsule Commonly known as:  VIBRAMYCIN   IBUPROFEN PO Take by mouth as needed.   mometasone 50 MCG/ACT nasal spray Commonly known as:  NASONEX USE 1 SPRAY IN EACH NOSTRIL 1 TO 2 TIMES A DAY AS DIRECTED   omeprazole 20 MG capsule Commonly known as:  PRILOSEC Take one capsule once daily   VALIUM 5 MG tablet Generic drug:  diazepam Take 5 mg by mouth every 6 (six) hours as needed for anxiety.   zafirlukast 20 MG tablet Commonly known as:  ACCOLATE TAKE ONE TABLET TWICE DAILY       Past Medical History:  Diagnosis Date  . Allergic rhinitis   . Asthma   . LPRD (laryngopharyngeal reflux disease)     Past Surgical History:  Procedure Laterality Date  . ADENOIDECTOMY    . HERNIA REPAIR    . SINOSCOPY    . TONSILLECTOMY      Review of systems negative except as noted in HPI / PMHx or noted below:  Review of Systems  Constitutional: Negative.   HENT: Negative.   Eyes: Negative.   Respiratory: Negative.   Cardiovascular: Negative.   Gastrointestinal: Negative.   Genitourinary: Negative.   Musculoskeletal: Negative.   Skin: Negative.   Neurological: Negative.   Endo/Heme/Allergies: Negative.   Psychiatric/Behavioral: Negative.      Objective:   Vitals:   01/23/18 1138  BP: 122/82  Pulse: 80  Resp: 16          Physical Exam  HENT:  Head: Normocephalic.  Right Ear: Tympanic membrane, external ear and ear canal normal.  Left Ear: Tympanic membrane, external ear and ear canal normal.  Nose: Nose normal. No mucosal edema or rhinorrhea.  Mouth/Throat: Uvula is midline, oropharynx is clear and moist and mucous membranes are normal. No oropharyngeal exudate.  Eyes: Conjunctivae are normal.  Neck: Trachea normal. No tracheal tenderness present. No tracheal deviation present. No thyromegaly present.  Cardiovascular: Normal rate, regular rhythm, S1 normal, S2 normal and normal  heart sounds.  No murmur heard. Pulmonary/Chest: Breath sounds normal. No stridor. No respiratory distress. He has no wheezes. He has no rales.  Musculoskeletal: He exhibits no edema.  Lymphadenopathy:       Head (right side): No tonsillar adenopathy present.       Head (left side): No tonsillar adenopathy present.    He has no cervical adenopathy.  Neurological: He is alert.  Skin: No rash noted. He is not diaphoretic. No erythema. Nails show no clubbing.    Diagnostics:    Spirometry was performed and demonstrated an FEV1 of 2.80 at 85 % of predicted.    Assessment and Plan:   1. Asthma, moderate persistent, well-controlled   2. Other allergic rhinitis   3. LPRD (laryngopharyngeal reflux disease)     1. Continue Symbicort 160 2 inhalations twice a day   2.  Continue QVAR 40 Redihaler 2 inhalation two times per day while 'Sick"  3. Continue Accolate 20 mg twice a day  4. Continue Nasonex one spray each nostril 1-2 times a day  5. Continue omeprazole 20 mg daily  6. If needed:    C. Astelin   B. Ventolin HFA  C. Mucinex DM  7. Return to clinic in 6 months or earlier if problem   Overall Ross Robinson has done relatively well other than the fact that he contracted an infectious disease around Thanksgiving.  That was really his first infection or respiratory flareup in a prolonged period in time.  He will remain on a combination of anti-inflammatory agents for his airway and therapy directed against reflux as noted above and I will see him back in this clinic in 6 months or earlier if there is a problem.  Laurette Schimke, MD Allergy / Immunology [facility]

## 2018-01-23 NOTE — Patient Instructions (Signed)
  1. Continue Symbicort 160 2 inhalations twice a day   2.  Continue QVAR 40 Redihaler 2 inhalation two times per day while 'Sick"  3. Continue Accolate 20 mg twice a day  4. Continue Nasonex one spray each nostril 1-2 times a day  5. Continue omeprazole 20 mg daily  6. If needed:    C. Astelin   B. Ventolin HFA  C. Mucinex DM  7. Return to clinic in 6 months or earlier if problem

## 2018-01-24 ENCOUNTER — Encounter: Payer: Self-pay | Admitting: Allergy and Immunology

## 2018-05-13 ENCOUNTER — Telehealth: Payer: Self-pay

## 2018-05-13 MED ORDER — SYMBICORT 160-4.5 MCG/ACT IN AERO: INHALATION_SPRAY | RESPIRATORY_TRACT | 0 refills | Status: DC

## 2018-05-13 NOTE — Telephone Encounter (Signed)
Patient called to inform us that his prescription for Symbicort 160 would not be covered by insurance unless it was specified to dispense brand name Symbicort. I have sent this in to [facility] as requested to [facility]. Patient will call back with any further questions or concerns.

## 2018-08-05 ENCOUNTER — Other Ambulatory Visit: Payer: Self-pay | Admitting: Allergy and Immunology

## 2018-08-05 NOTE — Telephone Encounter (Signed)
Courtesy refill  

## 2018-09-01 ENCOUNTER — Other Ambulatory Visit: Payer: Self-pay | Admitting: Allergy and Immunology

## 2018-09-04 ENCOUNTER — Other Ambulatory Visit: Payer: Self-pay | Admitting: Allergy and Immunology

## 2018-09-06 ENCOUNTER — Other Ambulatory Visit: Payer: Self-pay | Admitting: *Deleted

## 2018-09-06 MED ORDER — ZAFIRLUKAST 20 MG PO TABS: ORAL_TABLET | ORAL | 0 refills | Status: DC

## 2018-09-06 MED ORDER — MOMETASONE FUROATE 50 MCG/ACT NA SUSP: NASAL | 0 refills | Status: DC

## 2018-09-06 MED ORDER — ALBUTEROL SULFATE HFA 108 (90 BASE) MCG/ACT IN AERS: INHALATION_SPRAY | RESPIRATORY_TRACT | 0 refills | Status: DC

## 2018-09-06 MED ORDER — AZELASTINE HCL 0.1 % NA SOLN: NASAL | 0 refills | Status: DC

## 2018-09-06 MED ORDER — SYMBICORT 160-4.5 MCG/ACT IN AERO: INHALATION_SPRAY | RESPIRATORY_TRACT | 0 refills | Status: DC

## 2018-09-06 MED ORDER — OMEPRAZOLE 20 MG PO CPDR: DELAYED_RELEASE_CAPSULE | ORAL | 0 refills | Status: DC

## 2018-09-11 ENCOUNTER — Other Ambulatory Visit: Payer: Self-pay

## 2018-09-11 ENCOUNTER — Ambulatory Visit (INDEPENDENT_AMBULATORY_CARE_PROVIDER_SITE_OTHER): Payer: Medicare Other | Admitting: Allergy and Immunology

## 2018-09-11 DIAGNOSIS — J3089 Other allergic rhinitis: Secondary | ICD-10-CM | POA: Diagnosis not present

## 2018-09-11 DIAGNOSIS — J4540 Moderate persistent asthma, uncomplicated: Secondary | ICD-10-CM | POA: Diagnosis not present

## 2018-09-11 DIAGNOSIS — K219 Gastro-esophageal reflux disease without esophagitis: Secondary | ICD-10-CM | POA: Diagnosis not present

## 2018-09-11 NOTE — Progress Notes (Signed)
[facility] [place] [facility] [place]   Follow-up Note  Referring Provider: No ref. provider found Primary Provider: Patient, No Pcp Per Date of Office Visit: 09/11/2018  Subjective:   Ross Robinson. (DOB: 1950/08/26) is a 68 y.o. male who returns to the [facility] on 09/11/2018 in re-evaluation of the following:  HPI: This is an E - med visit requested by patient who is located at home.  Ross Robinson is followed in this clinic for asthma and allergic rhinoconjunctivitis and a history of chronic sinusitis and LPR.  His last visit to this clinic was 23 January 2018.  Lungs doing well. No systemic steroids or antibiotics for an airway issue. Using Symbicort 2 times per day. No activation of "Action Plan". Rare use of SABA. Can exercise.  Nose doing well while using Nasonex and astelin  Reflux under control with omeprazole. Careful about caffeine and chocolate.  Allergies as of 09/11/2018      Reactions   Biaxin [clarithromycin]    Ceftin [cefuroxime Axetil]    Codeine Nausea And Vomiting      Medication List    albuterol 108 (90 Base) MCG/ACT inhaler Commonly known as: Ventolin HFA Inhale two puffs every four to six hours as needed for cough or wheeze.   azelastine 0.1 % nasal spray Commonly known as: ASTELIN USE TWO SPRAYS IN EACH NOSTRIL TWICE DAILY IF NEEDED   beclomethasone 40 MCG/ACT inhaler Commonly known as: Qvar RediHaler Inhale two doses twice daily during asthma flare-up.  Rinse, gargle, and spit after use.   IBUPROFEN PO Take by mouth as needed.   mometasone 50 MCG/ACT nasal spray Commonly known as: NASONEX Use one spray in each nostril once or twice daily as directed   omeprazole 20 MG capsule Commonly known as: PRILOSEC Take one capsule once daily   Symbicort 160-4.5 MCG/ACT inhaler Generic drug: budesonide-formoterol Inhale two puffs twice daily to prevent cough or wheeze. Rinse mouth after use.   Valium 5 MG  tablet Generic drug: diazepam Take 5 mg by mouth every 6 (six) hours as needed for anxiety.   zafirlukast 20 MG tablet Commonly known as: ACCOLATE TAKE ONE TABLET TWICE DAILY       Past Medical History:  Diagnosis Date  . Allergic rhinitis   . Asthma   . LPRD (laryngopharyngeal reflux disease)     Past Surgical History:  Procedure Laterality Date  . ADENOIDECTOMY    . HERNIA REPAIR    . SINOSCOPY    . TONSILLECTOMY      Review of systems negative except as noted in HPI / PMHx or noted below:  Review of Systems  Constitutional: Negative.   HENT: Negative.   Eyes: Negative.   Respiratory: Negative.   Cardiovascular: Negative.   Gastrointestinal: Negative.   Genitourinary: Negative.   Musculoskeletal: Negative.   Skin: Negative.   Neurological: Negative.   Endo/Heme/Allergies: Negative.   Psychiatric/Behavioral: Negative.      Objective:   There were no vitals filed for this visit.        Physical Exam-deferred  Diagnostics:   The patient had an Asthma Control Test with the following results: ACT Total Score: 24.    Assessment and Plan:   1. Asthma, moderate persistent, well-controlled   2. Other allergic rhinitis   3. LPRD (laryngopharyngeal reflux disease)     1. Continue Symbicort 160 2 inhalations twice a day   2.  Continue QVAR 40 Redihaler 2 inhalation two  times per day while 'Sick"  3. Continue Accolate 20 mg twice a day  4. Continue Nasonex one spray each nostril 1-2 times a day  5. Continue omeprazole 20 mg daily  6. If needed:    C. Astelin   B. Ventolin HFA  C. Mucinex DM  7. Return to clinic in 6 months or earlier if problem  8. Obtain fall flu vaccine (and COVID vaccine)   Ross Robinson sounds as though he is doing well with his current plan which includes anti-inflammatory medications for his airway and therapy directed at reflux.  He will continue on his plan which includes anti-inflammatory agents for his airway and therapy  directed against reflux and I will see him back in this clinic in 6 months or earlier if there is a problem.  Total patient interaction time 20 minutes.  Laurette Schimke, MD Allergy / Immunology [facility]

## 2018-09-11 NOTE — Patient Instructions (Addendum)
  1. Continue Symbicort 160 2 inhalations twice a day   2.  Continue QVAR 40 Redihaler 2 inhalation two times per day while 'Sick"  3. Continue Accolate 20 mg twice a day  4. Continue Nasonex one spray each nostril 1-2 times a day  5. Continue omeprazole 20 mg daily  6. If needed:    C. Astelin   B. Ventolin HFA  C. Mucinex DM  7. Return to clinic in 6 months or earlier if problem  8. Obtain fall flu vaccine (and COVID vaccine)

## 2018-09-12 ENCOUNTER — Encounter: Payer: Self-pay | Admitting: Allergy and Immunology

## 2018-09-12 NOTE — Progress Notes (Signed)
Patient is in home. Provider is in office.  Start Time: 320 pm End Time: 350 pm

## 2018-12-05 ENCOUNTER — Other Ambulatory Visit: Payer: Self-pay | Admitting: Allergy and Immunology

## 2018-12-12 ENCOUNTER — Other Ambulatory Visit: Payer: Self-pay | Admitting: Allergy and Immunology

## 2019-02-27 ENCOUNTER — Other Ambulatory Visit: Payer: Self-pay | Admitting: Allergy and Immunology

## 2019-03-04 ENCOUNTER — Other Ambulatory Visit: Payer: Self-pay

## 2019-03-04 ENCOUNTER — Other Ambulatory Visit: Payer: Self-pay | Admitting: Allergy and Immunology

## 2019-03-04 NOTE — Telephone Encounter (Signed)
error 

## 2019-04-05 ENCOUNTER — Other Ambulatory Visit: Payer: Self-pay | Admitting: Allergy and Immunology

## 2019-04-21 ENCOUNTER — Other Ambulatory Visit: Payer: Self-pay

## 2019-04-21 ENCOUNTER — Encounter: Payer: Self-pay | Admitting: Family Medicine

## 2019-04-21 ENCOUNTER — Ambulatory Visit (INDEPENDENT_AMBULATORY_CARE_PROVIDER_SITE_OTHER): Payer: Medicare Other | Admitting: Family Medicine

## 2019-04-21 DIAGNOSIS — J4540 Moderate persistent asthma, uncomplicated: Secondary | ICD-10-CM

## 2019-04-21 DIAGNOSIS — J3089 Other allergic rhinitis: Secondary | ICD-10-CM

## 2019-04-21 DIAGNOSIS — K219 Gastro-esophageal reflux disease without esophagitis: Secondary | ICD-10-CM | POA: Insufficient documentation

## 2019-04-21 DIAGNOSIS — J309 Allergic rhinitis, unspecified: Secondary | ICD-10-CM | POA: Insufficient documentation

## 2019-04-21 HISTORY — DX: Other allergic rhinitis: J30.89

## 2019-04-21 HISTORY — DX: Moderate persistent asthma, uncomplicated: J45.40

## 2019-04-21 MED ORDER — OMEPRAZOLE 20 MG PO CPDR: 20.0000 mg | DELAYED_RELEASE_CAPSULE | Freq: Every day | ORAL | 1 refills | Status: DC

## 2019-04-21 MED ORDER — MOMETASONE FUROATE 50 MCG/ACT NA SUSP: NASAL | 1 refills | Status: DC

## 2019-04-21 MED ORDER — AZELASTINE HCL 0.1 % NA SOLN: NASAL | 1 refills | Status: DC

## 2019-04-21 MED ORDER — BUDESONIDE-FORMOTEROL FUMARATE 160-4.5 MCG/ACT IN AERO: INHALATION_SPRAY | RESPIRATORY_TRACT | 1 refills | Status: DC

## 2019-04-21 NOTE — Patient Instructions (Addendum)
Asthma Continue Accolate 20 mg twice a day to prevent cough or wheeze.  Continue Symbicort 160-2 puffs twice a day to prevent cough or wheeze Continue albuterol (Proventil)  puffs every 4 hours as needed for cough or wheeze  Allergic rhinitis Continue Nasonex 2 sprays in each nostril once a day as needed for a stuffy nose Continue azelastine nasal spray 2 sprays in each nostril once or twice a day as needed for a runny nose Consider saline nasal rinses as needed for nasal symptoms. Use this before any medicated nasal sprays for best result  Reflux Continue omeprazole 20 mg once a day as previously prescribed Continue dietary and lifestyle modifications (handout given)  Call the clinic if this treatment plan is not working well for you  Follow up in 6 months or sooner if needed.

## 2019-04-21 NOTE — Progress Notes (Signed)
RE: Ross Robinson. MRN: [id number] DOB: 1950-10-31 Date of Telemedicine Visit: 04/21/2019  Referring provider: No ref. provider found Primary care provider: Patient, No Pcp Per  Chief Complaint: Follow-up (patient needs refills )   Telemedicine Follow Up Visit via Telephone: I connected with Ross Robinson for a follow up on 04/21/19 by telephone and verified that I am speaking with the correct person using two identifiers.   I discussed the limitations, risks, security and privacy concerns of performing an evaluation and management service by telephone and the availability of in person appointments. I also discussed with the patient that there may be a patient responsible charge related to this service. The patient expressed understanding and agreed to proceed.  Patient is at home  Provider is at the office.  Visit start time: 10:02 Visit end time: 10:29 Insurance consent/check in by: Shan Levans Medical consent and medical assistant/nurse: Rosalita Levan  History of Present Illness: He is a 69 y.o. male, who is being followed for asthma, allergic rhinitis, and reflux. His previous allergy office visit was on 09/11/2018 with Dr. Lucie Leather. At today's visit, he reports his asthma has been well controlled with Accolate 20 mg twice a day, Symbicort 160-2 puffs twice a day and infrequent use of albuterol. Patient denies abdominal pain, nausea, fatigue, pruritus or jaundice. Allergic rhinitis is reported as well controlled with mometasone nasal spray in the evening and azelastine nasal spray in the morning. Reflux is reported as well controlled with no episodes of heartburn while continuing to take omeprazole 20 mg once a day. His current medications are listed in the chart.   Assessment and Plan: Asthma Continue Accolate 20 mg twice a day to prevent cough or wheeze.  Continue Symbicort 160-2 puffs twice a day to prevent cough or wheeze Continue albuterol (Proventil)  puffs every 4 hours as  needed for cough or wheeze  Allergic rhinitis Continue Nasonex 2 sprays in each nostril once a day as needed for a stuffy nose Continue azelastine nasal spray 2 sprays in each nostril once or twice a day as needed for a runny nose Consider saline nasal rinses as needed for nasal symptoms. Use this before any medicated nasal sprays for best result  Reflux Continue omeprazole 20 mg once a day as previously prescribed Continue dietary and lifestyle modifications (handout given)  Call the clinic if this treatment plan is not working well for you  Follow up in 6 months or sooner if needed. Return in about 6 months (around 10/22/2019), or if symptoms worsen or fail to improve.  Meds ordered this encounter  Medications  . azelastine (ASTELIN) 0.1 % nasal spray    Sig: USE TWO SPRAYS IN EACH NOSTRIL TWICE DAILY IF NEEDED    Dispense:  90 mL    Refill:  1  . omeprazole (PRILOSEC) 20 MG capsule    Sig: Take 1 capsule (20 mg total) by mouth daily.    Dispense:  90 capsule    Refill:  1  . budesonide-formoterol (SYMBICORT) 160-4.5 MCG/ACT inhaler    Sig: Take 2 puffs twice daily to prevent coughing or wheezing.    Dispense:  3 Inhaler    Refill:  1  . mometasone (NASONEX) 50 MCG/ACT nasal spray    Sig: Take 1 spray per nostril once or twice daily as directed.    Dispense:  51 g    Refill:  1   Lab Orders  No laboratory test(s) ordered today    Medication List:  Current Outpatient Medications  Medication Sig Dispense Refill  . albuterol (VENTOLIN HFA) 108 (90 Base) MCG/ACT inhaler Inhale two puffs every four to six hours as needed for cough or wheeze. 18 g 0  . azelastine (ASTELIN) 0.1 % nasal spray USE TWO SPRAYS IN EACH NOSTRIL TWICE DAILY IF NEEDED 90 mL 1  . budesonide-formoterol (SYMBICORT) 160-4.5 MCG/ACT inhaler Take 2 puffs twice daily to prevent coughing or wheezing. 3 Inhaler 1  . diazepam (VALIUM) 5 MG tablet Take 5 mg by mouth every 6 (six) hours as needed for anxiety.    .  IBUPROFEN PO Take by mouth as needed.    . mometasone (NASONEX) 50 MCG/ACT nasal spray Take 1 spray per nostril once or twice daily as directed. 51 g 1  . omeprazole (PRILOSEC) 20 MG capsule Take 1 capsule (20 mg total) by mouth daily. 90 capsule 1  . zafirlukast (ACCOLATE) 20 MG tablet TAKE 1 TABLET BY MOUTH TWICE A DAY 180 tablet 0  . beclomethasone (QVAR REDIHALER) 40 MCG/ACT inhaler Inhale two doses twice daily during asthma flare-up.  Rinse, gargle, and spit after use. (Patient not taking: Reported on 04/21/2019) 1 Inhaler 3   No current facility-administered medications for this visit.   Allergies: Allergies  Allergen Reactions  . Biaxin [Clarithromycin]   . Ceftin [Cefuroxime Axetil]   . Codeine Nausea And Vomiting   I reviewed his past medical history, social history, family history, and environmental history and no significant changes have been reported from previous visit on 09/11/2018.  Objective: Physical Exam Not obtained as encounter was done via telephone.   Previous notes and tests were reviewed.  I discussed the assessment and treatment plan with the patient. The patient was provided an opportunity to ask questions and all were answered. The patient agreed with the plan and demonstrated an understanding of the instructions.   The patient was advised to call back or seek an in-person evaluation if the symptoms worsen or if the condition fails to improve as anticipated.  I provided 27 minutes of non-face-to-face time during this encounter.  Thank you for the opportunity to care for this patient.  Please do not hesitate to contact me with questions.  Gareth Morgan, FNP [facility]  I have provided oversight concerning Gareth Morgan' evaluation and treatment of this patient's health issues addressed during today's encounter. I agree with the assessment and therapeutic plan as outlined in the note.   It was my pleasure to  participate in [facility] care today. Please feel free to contact me with any questions or concerns.   Sincerely,  Thank you for the opportunity to care for this patient.  Please do not hesitate to contact me with questions.  Penne Lash, M.D.  [facility] [address] [place], [place] [address] [phone]

## 2019-05-01 ENCOUNTER — Encounter: Payer: Self-pay | Admitting: Cardiology

## 2019-05-01 ENCOUNTER — Ambulatory Visit (INDEPENDENT_AMBULATORY_CARE_PROVIDER_SITE_OTHER): Payer: Medicare Other | Admitting: Allergy and Immunology

## 2019-05-01 ENCOUNTER — Encounter: Payer: Self-pay | Admitting: Allergy and Immunology

## 2019-05-01 ENCOUNTER — Other Ambulatory Visit: Payer: Self-pay

## 2019-05-01 VITALS — BP 118/72 | HR 130 | Temp 98.1°F | Resp 16 | Ht 70.0 in | Wt 199.4 lb

## 2019-05-01 DIAGNOSIS — I10 Essential (primary) hypertension: Secondary | ICD-10-CM

## 2019-05-01 DIAGNOSIS — J45909 Unspecified asthma, uncomplicated: Secondary | ICD-10-CM | POA: Diagnosis not present

## 2019-05-01 DIAGNOSIS — J3089 Other allergic rhinitis: Secondary | ICD-10-CM

## 2019-05-01 DIAGNOSIS — I361 Nonrheumatic tricuspid (valve) insufficiency: Secondary | ICD-10-CM

## 2019-05-01 DIAGNOSIS — I340 Nonrheumatic mitral (valve) insufficiency: Secondary | ICD-10-CM | POA: Diagnosis not present

## 2019-05-01 DIAGNOSIS — J4540 Moderate persistent asthma, uncomplicated: Secondary | ICD-10-CM

## 2019-05-01 DIAGNOSIS — I480 Paroxysmal atrial fibrillation: Secondary | ICD-10-CM

## 2019-05-01 DIAGNOSIS — K219 Gastro-esophageal reflux disease without esophagitis: Secondary | ICD-10-CM | POA: Diagnosis not present

## 2019-05-01 DIAGNOSIS — I4891 Unspecified atrial fibrillation: Secondary | ICD-10-CM

## 2019-05-01 DIAGNOSIS — E785 Hyperlipidemia, unspecified: Secondary | ICD-10-CM

## 2019-05-01 DIAGNOSIS — I5030 Unspecified diastolic (congestive) heart failure: Secondary | ICD-10-CM

## 2019-05-01 NOTE — Patient Instructions (Addendum)
  1. Continue Symbicort 160 2 inhalations twice a day   2.  Continue QVAR 40 Redihaler 2 inhalation two times per day while 'Sick"  3. Continue Accolate 20 mg twice a day  4. Continue Nasonex one spray each nostril 1-2 times a day  5. Continue omeprazole 20 mg daily  6. If needed:    C. Astelin   B. Ventolin HFA  C. Mucinex DM  7. Obtain EKG at Urgent Care today

## 2019-05-01 NOTE — Progress Notes (Signed)
[place] [facility] [place] [facility] [place]   Follow-up Note  Referring Provider: No ref. provider found Primary Provider: Patient, No Pcp Per Date of Office Visit: 05/01/2019  Subjective:   Ross Robinson. (DOB: 07-13-1950) is a 69 y.o. male who returns to the [facility] on 05/01/2019 in re-evaluation of the following:  HPI: Ross Robinson returns to this clinic in reevaluation of asthma and allergic rhinoconjunctivitis and a history of chronic sinusitis and LPR.  His last contact with this clinic was on 21 April 2019 via a e- med visit at which point in time he had refills of his medications directed against respiratory tract inflammation and reflux.  About 7 days ago he started to develop some problems with not being able to breathe very well and wheezing and some slight cough and this has progressed to very significant nocturnal symptoms where he really cannot breathe very well at night.  Has been using his bronchodilator extensively but is just not working.  He has had no fever or ugly sputum production or ugly nasal discharge or any type of head issue and no problems with reflux during this event.  He does feel as though his neck is a little bit full and his legs have swelled a little.  Prior to this event he was doing wonderful with his asthma and allergic rhinitis and reflux on a long-term plan of anti-inflammatory agents for his airway and therapy directed against reflux.  He has received both [employer] vaccines.  Allergies as of 05/01/2019      Reactions   Biaxin [clarithromycin]    Ceftin [cefuroxime Axetil]    Codeine Nausea And Vomiting      Medication List      albuterol 108 (90 Base) MCG/ACT inhaler Commonly known as: Ventolin HFA Inhale two puffs every four to six hours as needed for cough or wheeze.   azelastine 0.1 % nasal spray Commonly known as: ASTELIN USE TWO SPRAYS IN EACH NOSTRIL TWICE DAILY IF NEEDED   beclomethasone 40 MCG/ACT  inhaler Commonly known as: Qvar RediHaler Inhale two doses twice daily during asthma flare-up.  Rinse, gargle, and spit after use.   budesonide-formoterol 160-4.5 MCG/ACT inhaler Commonly known as: Symbicort Take 2 puffs twice daily to prevent coughing or wheezing.   IBUPROFEN PO Take by mouth as needed.   mometasone 50 MCG/ACT nasal spray Commonly known as: NASONEX Take 1 spray per nostril once or twice daily as directed.   omeprazole 20 MG capsule Commonly known as: PRILOSEC Take 1 capsule (20 mg total) by mouth daily.   Valium 5 MG tablet Generic drug: diazepam Take 5 mg by mouth every 6 (six) hours as needed for anxiety.   zafirlukast 20 MG tablet Commonly known as: ACCOLATE TAKE 1 TABLET BY MOUTH TWICE A DAY       Past Medical History:  Diagnosis Date  . Allergic rhinitis   . Asthma   . LPRD (laryngopharyngeal reflux disease)     Past Surgical History:  Procedure Laterality Date  . ADENOIDECTOMY    . HERNIA REPAIR    . SINOSCOPY    . TONSILLECTOMY      Review of systems negative except as noted in HPI / PMHx or noted below:  Review of Systems  Constitutional: Negative.   HENT: Negative.   Eyes: Negative.   Respiratory: Negative.   Cardiovascular: Negative.   Gastrointestinal: Negative.   Genitourinary: Negative.   Musculoskeletal: Negative.   Skin:  Negative.   Neurological: Negative.   Endo/Heme/Allergies: Negative.   Psychiatric/Behavioral: Negative.      Objective:   Vitals:   05/01/19 1358 05/01/19 1412  BP: 118/72   Pulse: 74 (!) 130  Resp: 16   Temp: 98.1 F (36.7 C)   SpO2: 95%    Height: 5\' 10"  (177.8 cm)  Weight: 199 lb 6.4 oz (90.4 kg)   Physical Exam Constitutional:      Appearance: He is not diaphoretic.  HENT:     Head: Normocephalic.     Right Ear: Tympanic membrane, ear canal and external ear normal.     Left Ear: Tympanic membrane, ear canal and external ear normal.     Nose: Nose normal. No mucosal edema or  rhinorrhea.     Mouth/Throat:     Pharynx: Uvula midline. No oropharyngeal exudate.  Eyes:     Conjunctiva/sclera: Conjunctivae normal.  Neck:     Thyroid: No thyromegaly.     Trachea: Trachea normal. No tracheal tenderness or tracheal deviation.  Cardiovascular:     Rate and Rhythm: Normal rate. Rhythm irregular.     Heart sounds: Normal heart sounds, S1 normal and S2 normal. No murmur.     Comments: Chest heart rate approximately 150. Pulmonary:     Effort: No respiratory distress.     Breath sounds: No stridor. Wheezing (End expiratory wheezes right lower lung field.) present. No rales.  Musculoskeletal:     Right lower leg: Edema present.     Left lower leg: Edema present.  Lymphadenopathy:     Head:     Right side of head: No tonsillar adenopathy.     Left side of head: No tonsillar adenopathy.     Cervical: No cervical adenopathy.  Skin:    Findings: No erythema or rash.     Nails: There is no clubbing.  Neurological:     Mental Status: He is alert.     Diagnostics:    Spirometry was performed and demonstrated an FEV1 of 1.56 at 46 % of predicted.  Assessment and Plan:   1. Paroxysmal atrial fibrillation (HCC)   2. Asthma, moderate persistent, well-controlled   3. Other allergic rhinitis   4. LPRD (laryngopharyngeal reflux disease)     1. Continue Symbicort 160 2 inhalations twice a day   2.  Continue QVAR 40 Redihaler 2 inhalation two times per day while 'Sick"  3. Continue Accolate 20 mg twice a day  4. Continue Nasonex one spray each nostril 1-2 times a day  5. Continue omeprazole 20 mg daily  6. If needed:    C. Astelin   B. Ventolin HFA  C. Mucinex DM  7. Obtain EKG at Urgent Care today  has some respiratory tract symptoms that are not responding to albuterol and he has a sensation of neck fullness and some degree of lower extremity swelling in the setting of significant tachycardia with a irregular heartbeat and we are obligated to  investigate for atrial fibrillation or other cardiac event and we will send him over to her local urgent care to get an EKG.  Further evaluation and treatment will be based upon the results of that EKG.  [name], MD Allergy / Immunology [facility]

## 2019-05-02 DIAGNOSIS — I340 Nonrheumatic mitral (valve) insufficiency: Secondary | ICD-10-CM

## 2019-05-02 DIAGNOSIS — I519 Heart disease, unspecified: Secondary | ICD-10-CM | POA: Diagnosis not present

## 2019-05-02 DIAGNOSIS — J45909 Unspecified asthma, uncomplicated: Secondary | ICD-10-CM | POA: Diagnosis not present

## 2019-05-02 DIAGNOSIS — I071 Rheumatic tricuspid insufficiency: Secondary | ICD-10-CM

## 2019-05-02 DIAGNOSIS — I4891 Unspecified atrial fibrillation: Secondary | ICD-10-CM | POA: Diagnosis not present

## 2019-05-02 DIAGNOSIS — I10 Essential (primary) hypertension: Secondary | ICD-10-CM | POA: Diagnosis not present

## 2019-05-02 DIAGNOSIS — I5030 Unspecified diastolic (congestive) heart failure: Secondary | ICD-10-CM | POA: Diagnosis not present

## 2019-05-02 DIAGNOSIS — E785 Hyperlipidemia, unspecified: Secondary | ICD-10-CM | POA: Diagnosis not present

## 2019-05-03 DIAGNOSIS — I480 Paroxysmal atrial fibrillation: Secondary | ICD-10-CM | POA: Diagnosis not present

## 2019-05-03 DIAGNOSIS — Z7901 Long term (current) use of anticoagulants: Secondary | ICD-10-CM | POA: Diagnosis not present

## 2019-05-03 DIAGNOSIS — I4891 Unspecified atrial fibrillation: Secondary | ICD-10-CM | POA: Diagnosis not present

## 2019-05-03 DIAGNOSIS — J45909 Unspecified asthma, uncomplicated: Secondary | ICD-10-CM | POA: Diagnosis not present

## 2019-05-03 DIAGNOSIS — I5030 Unspecified diastolic (congestive) heart failure: Secondary | ICD-10-CM | POA: Diagnosis not present

## 2019-05-03 DIAGNOSIS — E785 Hyperlipidemia, unspecified: Secondary | ICD-10-CM | POA: Diagnosis not present

## 2019-05-04 DIAGNOSIS — I480 Paroxysmal atrial fibrillation: Secondary | ICD-10-CM | POA: Diagnosis not present

## 2019-05-04 DIAGNOSIS — I4891 Unspecified atrial fibrillation: Secondary | ICD-10-CM | POA: Diagnosis not present

## 2019-05-04 DIAGNOSIS — Z7901 Long term (current) use of anticoagulants: Secondary | ICD-10-CM | POA: Diagnosis not present

## 2019-05-04 DIAGNOSIS — E785 Hyperlipidemia, unspecified: Secondary | ICD-10-CM | POA: Diagnosis not present

## 2019-05-04 DIAGNOSIS — I5030 Unspecified diastolic (congestive) heart failure: Secondary | ICD-10-CM | POA: Diagnosis not present

## 2019-05-04 DIAGNOSIS — J45909 Unspecified asthma, uncomplicated: Secondary | ICD-10-CM | POA: Diagnosis not present

## 2019-05-05 ENCOUNTER — Encounter: Payer: Self-pay | Admitting: Allergy and Immunology

## 2019-05-22 NOTE — Progress Notes (Signed)
Cardiology Office Note:    Date:  05/23/2019   ID:  Ross Ill., DOB 04/18/1950, MRN [id number]  PCP:  [name], [name]  Cardiologist:  Ross More, MD    Referring MD: No ref. provider found    ASSESSMENT:    1. Persistent atrial fibrillation ([name])   2. Essential hypertension   3. Chronic diastolic heart failure ([name])   4. Chronic anticoagulation    PLAN:    In order of problems listed above:  1. Atrial fibrillation persistent rate is controlled he will be anticoagulated for 3 weeks as of Monday I am with his anticoagulant will plan cardioversion next week.  If successful start an amiodarone 400 mg daily for 2 weeks then 200 mg/day. 2. Stable continue current treatment beta-blocker calcium channel blocker 3. Compensated has no fluid overload at this time not on a loop diuretic   Next appointment: 1 month   Medication Adjustments/Labs and Tests Ordered: Current medicines are reviewed at length with the patient today.  Concerns regarding medicines are outlined above.  No orders of the defined types were placed in this encounter.  No orders of the defined types were placed in this encounter.   No chief complaint on file.   History of Present Illness:    Ross Robinson. is a 69 y.o. male with a hx of asthma and atrial fibrillation last seen by me at [facility] 05/04/2019.  At that time rate was controlled he was initiated on anticoagulation with plans for office follow-up and outpatient cardioversion.  While in hospital he had heart failure diastolic which was controlled with a low-dose diuretic.  Stable discharge from the hospital hemoglobin was 15.0 platelets 198,000 potassium 3.7 creatinine 0.8.  He had presented to [facility] 05/01/2019 with shortness of breath he was found to be in rapid atrial fibrillation rate was controlled initially with intravenous and subsequently oral Cardizem.  Echocardiogram showed ejection fraction in the range  of 45 to 50% mild left atrial enlargement mild mitral and mild to moderate tricuspid regurgitation.  His proBNP level was modestly elevated 1250 serial troponins were normal testing for COVID-19 negative.. Compliance with diet, lifestyle and medications: Yes  This became a very complicated visit.  As of Monday he will be on his anticoagulant for 3 weeks.  He has checked his heart rhythm every day using the iPhone adapter I reviewed the strips and all the Robinson atrial fibrillation and Robinson recently the rate is controlled.  He is having no exercise intolerance dyspnea chest pain palpitation or syncope.  Reviewed the option of resuming sinus rhythm he wants to review the option of cardioversion antiarrhythmic drug versus EP ablation he prefers the former.  We will set him up for cardioversion next week.  If successful I would place on low-dose amiodarone as an antiarrhythmic drug as he had heart failure and mildly reduced ejection fraction.  He has been completely compliant with his anticoagulant.  He had heart failure in the hospital but has no edema orthopnea.  He has multiple questions about alcohol can use it moderate diet avoid adding sodium and over-the-counter medications and I will give him a list of drugs to avoid for seasonal allergies.  He intends to travel to [place] in the fall and I told him I do not see a problem with that plan provided safe from the perspective of COVID-19 Past Medical History:  Diagnosis Date  . Allergic rhinitis   . Asthma   .  LPRD (laryngopharyngeal reflux disease)   . Moderate persistent asthma without complication 04/21/2019  . Other allergic rhinitis 04/21/2019    Past Surgical History:  Procedure Laterality Date  . ADENOIDECTOMY    . HERNIA REPAIR    . SINOSCOPY    . TONSILLECTOMY      Current Medications: Current Meds  Medication Sig  . Acetaminophen (TYLENOL PO) Take 500 mg by mouth as needed.  Marland Kitchen albuterol (VENTOLIN HFA) 108 (90 Base) MCG/ACT inhaler  Inhale two puffs every four to six hours as needed for cough or wheeze.  Marland Kitchen azelastine (ASTELIN) 0.1 % nasal spray USE TWO SPRAYS IN EACH NOSTRIL TWICE DAILY IF NEEDED  . beclomethasone (QVAR REDIHALER) 40 MCG/ACT inhaler Inhale two doses twice daily during asthma flare-up.  Rinse, gargle, and spit after use.  . budesonide-formoterol (SYMBICORT) 160-4.5 MCG/ACT inhaler Take 2 puffs twice daily to prevent coughing or wheezing.  . diazepam (VALIUM) 5 MG tablet Take 5 mg by mouth every 6 (six) hours as needed for anxiety.  Marland Kitchen diltiazem (CARDIZEM) 60 MG tablet Take 60 mg by mouth every 6 (six) hours.  Marland Kitchen ELIQUIS 5 MG TABS tablet Take 5 mg by mouth 2 (two) times daily.  . metoprolol tartrate (LOPRESSOR) 25 MG tablet Take 25 mg by mouth 2 (two) times daily.  . mometasone (NASONEX) 50 MCG/ACT nasal spray Take 1 spray per nostril once or twice daily as directed.  Marland Kitchen omeprazole (PRILOSEC) 20 MG capsule Take 1 capsule (20 mg total) by mouth daily.  . zafirlukast (ACCOLATE) 20 MG tablet TAKE 1 TABLET BY MOUTH TWICE A DAY     Allergies:   Biaxin [clarithromycin], Ceftin [cefuroxime axetil], and Codeine   Social History   Socioeconomic History  . Marital status: Married    Spouse name: Not on file  . Number of children: Not on file  . Years of education: Not on file  . Highest education level: Not on file  Occupational History  . Not on file  Tobacco Use  . Smoking status: Never Smoker  . Smokeless tobacco: Never Used  Substance and Sexual Activity  . Alcohol use: Yes    Alcohol/week: 0.0 standard drinks  . Drug use: No  . Sexual activity: Not Currently  Other Topics Concern  . Not on file  Social History Narrative  . Not on file   Social Determinants of Health   Financial Resource Strain:   . Difficulty of Paying Living Expenses:   Food Insecurity:   . Worried About Programme researcher, broadcasting/film/video in the Last Year:   . Barista in the Last Year:   Transportation Needs:   . Automotive engineer (Medical):   Marland Kitchen Lack of Transportation (Non-Medical):   Physical Activity:   . Days of Exercise per Week:   . Minutes of Exercise per Session:   Stress:   . Feeling of Stress :   Social Connections:   . Frequency of Communication with Friends and Family:   . Frequency of Social Gatherings with Friends and Family:   . Attends Religious Services:   . Active Member of Clubs or Organizations:   . Attends Banker Meetings:   Marland Kitchen Marital Status:      Family History: The patient's family history is negative for Allergic rhinitis, Angioedema, Asthma, Atopy, Eczema, Immunodeficiency, and Urticaria. ROS:   Please see the history of present illness.    All other systems reviewed and are negative.  EKGs/Labs/Other Studies Reviewed:  The following studies were reviewed today:  EKG:  EKG ordered today and personally reviewed.  The ekg ordered today demonstrates atrial fibrillation controlled rate  Recent Labs: No results found for requested labs within last 8760 hours.  Recent Lipid Panel No results found for: CHOL, TRIG, HDL, CHOLHDL, VLDL, LDLCALC, LDLDIRECT  Physical Exam:    VS:  BP 106/84   Pulse 94   Ht 5\' 10"  (1.778 m)   Wt 176 lb 3.2 oz (79.9 kg)   SpO2 99%   BMI 25.28 kg/m     Wt Readings from Last 3 Encounters:  05/23/19 176 lb 3.2 oz (79.9 kg)  05/01/19 199 lb 6.4 oz (90.4 kg)  07/05/15 187 lb (84.8 kg)     GEN:  Well nourished, well developed in no acute distress HEENT: Normal NECK: No JVD; No carotid bruits LYMPHATICS: No lymphadenopathy CARDIAC: Irregular S1 variable RRR, no murmurs, rubs, gallops RESPIRATORY:  Clear to auscultation without rales, wheezing or rhonchi  ABDOMEN: Soft, non-tender, non-distended MUSCULOSKELETAL:  No edema; No deformity  SKIN: Warm and dry NEUROLOGIC:  Alert and oriented x 3 PSYCHIATRIC:  Normal affect    Signed, 07/07/15, MD  05/23/2019 10:37 AM    [facility]

## 2019-05-23 ENCOUNTER — Telehealth: Payer: Self-pay | Admitting: Cardiology

## 2019-05-23 ENCOUNTER — Encounter: Payer: Self-pay | Admitting: Cardiology

## 2019-05-23 ENCOUNTER — Ambulatory Visit (INDEPENDENT_AMBULATORY_CARE_PROVIDER_SITE_OTHER): Payer: Medicare Other | Admitting: Cardiology

## 2019-05-23 ENCOUNTER — Other Ambulatory Visit: Payer: Self-pay

## 2019-05-23 VITALS — BP 106/84 | HR 94 | Ht 70.0 in | Wt 176.2 lb

## 2019-05-23 DIAGNOSIS — I5032 Chronic diastolic (congestive) heart failure: Secondary | ICD-10-CM

## 2019-05-23 DIAGNOSIS — I10 Essential (primary) hypertension: Secondary | ICD-10-CM | POA: Diagnosis not present

## 2019-05-23 DIAGNOSIS — Z7901 Long term (current) use of anticoagulants: Secondary | ICD-10-CM | POA: Diagnosis not present

## 2019-05-23 DIAGNOSIS — I4819 Other persistent atrial fibrillation: Secondary | ICD-10-CM

## 2019-05-23 NOTE — Patient Instructions (Addendum)
Medication Instructions:  Your physician recommends that you continue on your current medications as directed. Please refer to the Current Medication list given to you today.  *If you need a refill on your cardiac medications before your next appointment, please call your pharmacy*   Lab Work: Your physician recommends that you return for lab work in: TODAY BMP, ProBNP, CBC  You will need a COVID swab done within 7 days prior to your cardioversion. You can get this done at an urgent care but we must have the results brought in to [place] prior to your scheduled cardioversion so that we can fax it to [facility].  If you have labs (blood work) drawn today and your tests are completely normal, you will receive your results only by: Marland Kitchen MyChart Message (if you have MyChart) OR . A paper copy in the mail If you have any lab test that is abnormal or we need to change your treatment, we will call you to review the results.   Testing/Procedures: Dear  Mr. Ross Robinson are scheduled for a TEE/Cardioversion/TEE Cardioversion on ____ with Dr. _____.  You will be scheduled for a cardioversion at [facility] [address] [place], [place] [address]  DIET: Nothing to eat or drink after midnight except a sip of water with medications (see medication instructions below)  Medication Instructions: You have no medications to hold.  Continue your anticoagulant: Elequis You will need to continue your anticoagulant after your procedure until you are told by your  Provider that it is safe to stop  You must have a responsible person to drive you home and stay in the waiting area during your procedure. Failure to do so could result in cancellation.  Bring your insurance cards.  *Special Note: Every effort is made to have your procedure done on time. Occasionally there are emergencies that occur at the hospital that may cause delays. Please be patient if a delay does occur.      Follow-Up: At [facility], you and your health needs are our priority.  As part of our continuing mission to provide you with exceptional heart care, we have created designated Provider Care Teams.  These Care Teams include your primary Cardiologist (physician) and Advanced Practice Providers (APPs -  Physician Assistants and Nurse Practitioners) who all work together to provide you with the care you need, when you need it.  We recommend signing up for the patient portal called "MyChart".  Sign up information is provided on this After Visit Summary.  MyChart is used to connect with patients for Virtual Visits (Telemedicine).  Patients are able to view lab/test results, encounter notes, upcoming appointments, etc.  Non-urgent messages can be sent to your provider as well.   To learn more about what you can do with MyChart, go to [email or web address].    Your next appointment:   1 month(s)  The format for your next appointment:   In Person  Provider:   Norman Herrlich, MD  1. Avoid all over-the-counter antihistamines except Claritin/Loratadine and Zyrtec/Cetrizine. 2. Avoid all combination including cold sinus allergies flu decongestant and sleep medications 3. You can use Robitussin DM Mucinex and Mucinex DM for cough. 4. can use Tylenol aspirin ibuprofen and naproxen but no combinations such as sleep or sinus.

## 2019-05-23 NOTE — Telephone Encounter (Signed)
Ross Robinson is calling requesting a nurse call him to go over the do's and don't's of over the counter medications he can take. He states he was previously discussing this with Dr. Dulce Sellar at his appt today and Dr. Dulce Sellar advised him to no longer take the medication he was taking and stated he would give him a list of alternatives. Ross Robinson did not receive that list before leaving his appt and would like to know if he can come pick that up after discussing with a nurse. Please advise.

## 2019-05-23 NOTE — Telephone Encounter (Signed)
Medication list is on page 2 of AVS.  I spoke with patient and he was able to locate on his AVS.

## 2019-05-23 NOTE — Addendum Note (Signed)
Addended by: Delorse Limber I on: 05/23/2019 11:13 AM   Modules accepted: Orders

## 2019-05-24 LAB — BASIC METABOLIC PANEL
BUN/Creatinine Ratio: 19 (ref 10–24)
BUN: 23 mg/dL (ref 8–27)
CO2: 22 mmol/L (ref 20–29)
Calcium: 9.4 mg/dL (ref 8.6–10.2)
Chloride: 109 mmol/L — ABNORMAL HIGH (ref 96–106)
Creatinine, Ser: 1.23 mg/dL (ref 0.76–1.27)
GFR calc Af Amer: 70 mL/min/{1.73_m2} (ref >59)
GFR calc non Af Amer: 60 mL/min/{1.73_m2} (ref >59)
Glucose: 72 mg/dL (ref 65–99)
Potassium: 4.9 mmol/L (ref 3.5–5.2)
Sodium: 146 mmol/L — ABNORMAL HIGH (ref 134–144)

## 2019-05-24 LAB — CBC
Hematocrit: 52.2 % — ABNORMAL HIGH (ref 37.5–51.0)
Hemoglobin: 17.6 g/dL (ref 13.0–17.7)
MCH: 32.5 pg (ref 26.6–33.0)
MCHC: 33.7 g/dL (ref 31.5–35.7)
MCV: 96 fL (ref 79–97)
Platelets: 271 10*3/uL (ref 150–450)
RBC: 5.42 x10E6/uL (ref 4.14–5.80)
RDW: 12.4 % (ref 11.6–15.4)
WBC: 7 10*3/uL (ref 3.4–10.8)

## 2019-05-24 LAB — PRO B NATRIURETIC PEPTIDE: NT-Pro BNP: 911 pg/mL — ABNORMAL HIGH (ref 0–376)

## 2019-05-26 ENCOUNTER — Telehealth: Payer: Self-pay

## 2019-05-26 NOTE — Telephone Encounter (Signed)
Spoke with patient in regards to scheduled cardioversion. I let him know that he is scheduled for Thursday 05/29/19 and needs to arrive at 7:30 am. He verbalizes understanding and states that he will bring his COVID swab results to the office as soon as he gets them.

## 2019-05-26 NOTE — Telephone Encounter (Signed)
Spoke with patient regarding results.  Patient states that he is on his way to get his COVID swab done for his cardioversion scheduled at [facility] later this week. He states that he will bring [place] the results from the COVID swab to the office in the next 1-2 days.  Patient verbalizes understanding and is agreeable to plan of care. Advised patient to call back with any issues or concerns.

## 2019-05-28 ENCOUNTER — Telehealth: Payer: Self-pay | Admitting: Cardiology

## 2019-05-28 NOTE — Telephone Encounter (Signed)
Spoke to patient regarding taking his Eliquis before his cardioversion tomorrow. I confirmed with him that he should take his Eliquis before the cardioversion and he states that he will do so.    Encouraged patient to call back with any questions or concerns.

## 2019-05-28 NOTE — Telephone Encounter (Signed)
Spoke with Kriste Basque at the [facility] OR. I let her know that the patients COVID results were faxed over to them just a couple of minutes ago.   She states to disregard the messages that were sent that stated that the cardioversion would be canceled.

## 2019-05-28 NOTE — Telephone Encounter (Signed)
Becky, with [facility] OR states she is calling to inquire about whether or not the office has received the patient's COVID-19 test results. Please return call to discuss.

## 2019-05-28 NOTE — Telephone Encounter (Signed)
Pt walked in with questions about medications before his cardioversion

## 2019-05-28 NOTE — Telephone Encounter (Signed)
Will forward this request to Dr. Dulce Sellar and his covering RN for further review and follow-up with Kriste Basque at [facility] OR, inquiring about pts covid test results.

## 2019-05-29 ENCOUNTER — Encounter: Payer: Self-pay | Admitting: Cardiology

## 2019-05-29 DIAGNOSIS — I4819 Other persistent atrial fibrillation: Secondary | ICD-10-CM

## 2019-05-29 NOTE — Progress Notes (Signed)
He failed outpatient cardioversion today and will be referred to Dr. Elberta Fortis for EP ablation. I will ask him to reach out and contact the patient to be seen in the [facility] office and he will remain anticoagulated with rate control.

## 2019-06-02 ENCOUNTER — Telehealth: Payer: Self-pay

## 2019-06-02 ENCOUNTER — Telehealth: Payer: Self-pay | Admitting: Cardiology

## 2019-06-02 DIAGNOSIS — I4891 Unspecified atrial fibrillation: Secondary | ICD-10-CM

## 2019-06-02 MED ORDER — METOPROLOL TARTRATE 25 MG PO TABS: 25.0000 mg | ORAL_TABLET | Freq: Two times a day (BID) | ORAL | 3 refills | Status: DC

## 2019-06-02 MED ORDER — ELIQUIS 5 MG PO TABS: 5.0000 mg | ORAL_TABLET | Freq: Two times a day (BID) | ORAL | 3 refills | Status: DC

## 2019-06-02 MED ORDER — DILTIAZEM HCL 60 MG PO TABS: 60.0000 mg | ORAL_TABLET | Freq: Four times a day (QID) | ORAL | 3 refills | Status: DC

## 2019-06-02 NOTE — Telephone Encounter (Signed)
Need referral entered

## 2019-06-02 NOTE — Telephone Encounter (Signed)
EP referral placed per Dr. Dulce Sellar verbal order.

## 2019-06-02 NOTE — Telephone Encounter (Signed)
Pt c/o medication issue:  1. Name of Medication:  diltiazem (CARDIZEM) 60 MG tablet ELIQUIS 5 MG TABS tablet metoprolol tartrate (LOPRESSOR) 25 MG tablet  2. How are you currently taking this medication (dosage and times per day)? Diltiazem: 1 tablet every 6 hrs, eliquis: 1 tablet twice a day, metoprolol: 1 tablet twice a day  3. Are you having a reaction (difficulty breathing--STAT)? no  4. What is your medication issue? Patient would like to know if he is to continue taking these medications. He would also like to know the name of the doctor he is being referred to and their office number.

## 2019-06-02 NOTE — Telephone Encounter (Signed)
Spoke with the patient and let him know that he should continue taking his medications until told otherwise by the doctor. He requests that refills be sent in and this is done. He also asked for the name of the electrophysiology doctor that would be calling him to schedule an appointment. I gave him the name of Dr. Elberta Fortis so that he is aware.    Encouraged patient to call back with any questions or concerns.

## 2019-06-06 ENCOUNTER — Telehealth: Payer: Self-pay | Admitting: Cardiology

## 2019-06-06 NOTE — Telephone Encounter (Signed)
This happens at times with atrial fibrillation lets have him take an extra tablet of metoprolol he can take an extra in the middle of the day and if his rates remain greater than 110 he can also take an extra tablet at bedtime daily.  In short he will plan on taking it 3 times daily morning midday with his evening meal and make the decision about the extra dose at bedtime.

## 2019-06-06 NOTE — Telephone Encounter (Signed)
Ross Daub, MD  You 5 minutes ago (11:44 AM)     This happens at times with atrial fibrillation lets have him take an extra tablet of metoprolol he can take an extra in the middle of the day and if his rates remain greater than 110 he can also take an extra tablet at bedtime daily.  In short he will plan on taking it 3 times daily morning midday with his evening meal and make the decision about the extra dose at bedtime.      Called the patient and made him aware of the above advisements from Dr. Dulce Sellar. He verbalized understanding and is aware to call back if he has any other concerns.

## 2019-06-06 NOTE — Telephone Encounter (Signed)
Ross Robinson is calling stating the his at home EKG read 143 last night 06/05/19, 130 yesterday during the day 06/05/19, 122 the night before 06/04/19, & 143-148 during the day 06/04/19. Befor this his reading were in the low teens or below. Ross Robinson states that he didn't know if Dr. Dulce Sellar wanted him to report these reading since he advised him to get this, but just wanted to make him aware incase that meant something. Please advise.

## 2019-06-20 NOTE — Progress Notes (Signed)
Cardiology Office Note:    Date:  06/23/2019   ID:  Ross Ill., DOB September 07, 1950, MRN [id number]  PCP:  Pc, [facility]  Cardiologist:  Shirlee More, MD    Referring MD: Pc, [facility]    ASSESSMENT:    1. Persistent atrial fibrillation ([place])   2. Chronic anticoagulation   3. Chronic diastolic heart failure ([place])   4. Hypertensive heart disease with chronic combined systolic and diastolic congestive heart failure ([place])    PLAN:    In order of problems listed above:  1. Unfortunately failed to maintain sinus rhythm, with heart failure and LV dysfunction I think he is best served by referral to EP for PVI and I facilitate his appointment moving up to this point stent.  He will continue his current beta-blocker calcium channel blocker for rate control and anticoagulation.  I told him I would see him back in my office once he is recovered. 2. Heart failure is compensated Heart Association class I currently not on a loop diuretic.   Next appointment: As needed after EP treatment   Medication Adjustments/Labs and Tests Ordered: Current medicines are reviewed at length with the patient today.  Concerns regarding medicines are outlined above.  No orders of the defined types were placed in this encounter.  No orders of the defined types were placed in this encounter.   Chief Complaint  Patient presents with  . Atrial Fibrillation    History of Present Illness:    Ross Robinson. is a 69 y.o. male with a hx of asthma and atrial fibrillation  initially seen by me at [facility] 05/04/2019.  At that time rate was controlled he was initiated on anticoagulation with plans for office follow-up and outpatient cardioversion.  While in hospital he had heart failure diastolic which was controlled with a low-dose diuretic.  Stable discharge from the hospital hemoglobin was 15.0 platelets 198,000 potassium 3.7 creatinine 0.8.  He had presented to [facility] 05/01/2019 with shortness of breath he was found to be in rapid atrial fibrillation rate was controlled initially with intravenous and subsequently oral Cardizem.  Echocardiogram showed ejection fraction in the range of 45 to 50% mild left atrial enlargement mild mitral and mild to moderate tricuspid regurgitation.  His proBNP level was modestly elevated 1250 serial troponins were normal testing for COVID-19 negative.  Had outpatient cardioversion twice and each time within 1 minute of resuming sinus rhythm developed atrial fibrillation again.  He was referred to EP for pulmonary vein isolation scheduled for consultation 08/18/2019 He was last seen 05/23/2019. Compliance with diet, lifestyle and medications: Yes  Home heart rate is running more toward the range of 90 to 120 bpm at rest and takes 4 times daily short acting beta-blocker calcium channel blocker.  He is not short of breath does not have edema and is back to normal activities.  He had a delay in EP consultation he will see Dr. Thompson Grayer Wednesday 12 noon regarding PVI is compliant with his anticoagulant with no bleeding complication. Past Medical History:  Diagnosis Date  . Allergic rhinitis   . Asthma   . LPRD (laryngopharyngeal reflux disease)   . Moderate persistent asthma without complication 03/30/3660  . Other allergic rhinitis 04/21/2019    Past Surgical History:  Procedure Laterality Date  . ADENOIDECTOMY    . HERNIA REPAIR    . SINOSCOPY    . TONSILLECTOMY      Current Medications: Current  Meds  Medication Sig  . Acetaminophen (TYLENOL PO) Take 500 mg by mouth as needed.  Marland Kitchen albuterol (VENTOLIN HFA) 108 (90 Base) MCG/ACT inhaler Inhale two puffs every four to six hours as needed for cough or wheeze.  Marland Kitchen azelastine (ASTELIN) 0.1 % nasal spray USE TWO SPRAYS IN EACH NOSTRIL TWICE DAILY IF NEEDED  . budesonide-formoterol (SYMBICORT) 160-4.5 MCG/ACT inhaler Take 2 puffs twice daily to prevent coughing or wheezing.    . diazepam (VALIUM) 10 MG tablet Take 5 mg by mouth every 6 (six) hours as needed for anxiety.  Marland Kitchen diltiazem (CARDIZEM) 60 MG tablet Take 1 tablet (60 mg total) by mouth every 6 (six) hours.  Marland Kitchen ELIQUIS 5 MG TABS tablet Take 1 tablet (5 mg total) by mouth 2 (two) times daily.  . hydrocortisone (ANUSOL-HC) 2.5 % rectal cream 1 application daily as needed.  . metoprolol tartrate (LOPRESSOR) 25 MG tablet Take 25 mg by mouth in the morning, at noon, in the evening, and at bedtime.  . mometasone (NASONEX) 50 MCG/ACT nasal spray Take 1 spray per nostril once or twice daily as directed.  Marland Kitchen omeprazole (PRILOSEC) 20 MG capsule Take 1 capsule (20 mg total) by mouth daily.  . zafirlukast (ACCOLATE) 20 MG tablet TAKE 1 TABLET BY MOUTH TWICE A DAY     Allergies:   Biaxin [clarithromycin], Ceftin [cefuroxime axetil], and Codeine   Social History   Socioeconomic History  . Marital status: Married    Spouse name: Not on file  . Number of children: Not on file  . Years of education: Not on file  . Highest education level: Not on file  Occupational History  . Not on file  Tobacco Use  . Smoking status: Never Smoker  . Smokeless tobacco: Never Used  Substance and Sexual Activity  . Alcohol use: Yes    Alcohol/week: 0.0 standard drinks  . Drug use: No  . Sexual activity: Not Currently  Other Topics Concern  . Not on file  Social History Narrative  . Not on file   Social Determinants of Health   Financial Resource Strain:   . Difficulty of Paying Living Expenses:   Food Insecurity:   . Worried About Programme researcher, broadcasting/film/video in the Last Year:   . Barista in the Last Year:   Transportation Needs:   . Freight forwarder (Medical):   Marland Kitchen Lack of Transportation (Non-Medical):   Physical Activity:   . Days of Exercise per Week:   . Minutes of Exercise per Session:   Stress:   . Feeling of Stress :   Social Connections:   . Frequency of Communication with Friends and Family:   .  Frequency of Social Gatherings with Friends and Family:   . Attends Religious Services:   . Active Member of Clubs or Organizations:   . Attends Banker Meetings:   Marland Kitchen Marital Status:      Family History: The patient's family history is negative for Allergic rhinitis, Angioedema, Asthma, Atopy, Eczema, Immunodeficiency, and Urticaria. ROS:   Please see the history of present illness.    All other systems reviewed and are negative.  EKGs/Labs/Other Studies Reviewed:    The following studies were reviewed today:  EKG: I reviewed daily Kardia transmissions since hospital discharge with rate controlled atrial fibrillation  Recent Labs: 05/23/2019: BUN 23; Creatinine, Ser 1.23; Hemoglobin 17.6; NT-Pro BNP 911; Platelets 271; Potassium 4.9; Sodium 146  Recent Lipid Panel No results found for: CHOL, TRIG,  HDL, CHOLHDL, VLDL, LDLCALC, LDLDIRECT  Physical Exam:    VS:  BP 100/80   Pulse (!) 112   Temp (!) 97.3 F (36.3 C)   Ht 5\' 10"  (1.778 m)   Wt 180 lb 12.8 oz (82 kg)   SpO2 95%   BMI 25.94 kg/m     Wt Readings from Last 3 Encounters:  06/23/19 180 lb 12.8 oz (82 kg)  05/23/19 176 lb 3.2 oz (79.9 kg)  05/01/19 199 lb 6.4 oz (90.4 kg)     GEN:  Well nourished, well developed in no acute distress HEENT: Normal NECK: No JVD; No carotid bruits LYMPHATICS: No lymphadenopathy CARDIAC: Irregular no S3 no murmur no murmurs, rubs, gallops RESPIRATORY:  Clear to auscultation without rales, wheezing or rhonchi  ABDOMEN: Soft, non-tender, non-distended MUSCULOSKELETAL:  No edema; No deformity  SKIN: Warm and dry NEUROLOGIC:  Alert and oriented x 3 PSYCHIATRIC:  Normal affect    Signed, 07/01/19, MD  06/23/2019 11:34 AM    [facility]

## 2019-06-23 ENCOUNTER — Other Ambulatory Visit: Payer: Self-pay

## 2019-06-23 ENCOUNTER — Encounter: Payer: Self-pay | Admitting: Cardiology

## 2019-06-23 ENCOUNTER — Ambulatory Visit (INDEPENDENT_AMBULATORY_CARE_PROVIDER_SITE_OTHER): Payer: Medicare Other | Admitting: Cardiology

## 2019-06-23 VITALS — BP 100/80 | HR 112 | Temp 97.3°F | Ht 70.0 in | Wt 180.8 lb

## 2019-06-23 DIAGNOSIS — Z7901 Long term (current) use of anticoagulants: Secondary | ICD-10-CM | POA: Diagnosis not present

## 2019-06-23 DIAGNOSIS — I4819 Other persistent atrial fibrillation: Secondary | ICD-10-CM

## 2019-06-23 DIAGNOSIS — I5042 Chronic combined systolic (congestive) and diastolic (congestive) heart failure: Secondary | ICD-10-CM

## 2019-06-23 DIAGNOSIS — I5032 Chronic diastolic (congestive) heart failure: Secondary | ICD-10-CM | POA: Diagnosis not present

## 2019-06-23 DIAGNOSIS — I110 Hypertensive heart disease with heart failure: Secondary | ICD-10-CM

## 2019-06-23 MED ORDER — METOPROLOL TARTRATE 25 MG PO TABS: 25.0000 mg | ORAL_TABLET | Freq: Four times a day (QID) | ORAL | 3 refills | Status: DC

## 2019-06-23 NOTE — Patient Instructions (Addendum)
Medication Instructions:  Your physician recommends that you continue on your current medications as directed. Please refer to the Current Medication list given to you today.  *If you need a refill on your cardiac medications before your next appointment, please call your pharmacy*   Lab Work: None If you have labs (blood work) drawn today and your tests are completely normal, you will receive your results only by: Marland Kitchen MyChart Message (if you have MyChart) OR . A paper copy in the mail If you have any lab test that is abnormal or we need to change your treatment, we will call you to review the results.   Testing/Procedures: None   Follow-Up: At [facility], you and your health needs are our priority.  As part of our continuing mission to provide you with exceptional heart care, we have created designated Provider Care Teams.  These Care Teams include your primary Cardiologist (physician) and Advanced Practice Providers (APPs -  Physician Assistants and Nurse Practitioners) who all work together to provide you with the care you need, when you need it.  We recommend signing up for the patient portal called "MyChart".  Sign up information is provided on this After Visit Summary.  MyChart is used to connect with patients for Virtual Visits (Telemedicine).  Patients are able to view lab/test results, encounter notes, upcoming appointments, etc.  Non-urgent messages can be sent to your provider as well.   To learn more about what you can do with MyChart, go to [email or web address].    Your next appointment:  Please call to Follow up after you are finished with Dr. Johney Frame  The format for your next appointment:   In Person  Provider:   Norman Herrlich, MD   Other Instructions

## 2019-06-23 NOTE — Addendum Note (Signed)
Addended by: Delorse Limber I on: 06/23/2019 11:43 AM   Modules accepted: Orders

## 2019-06-25 ENCOUNTER — Encounter: Payer: Self-pay | Admitting: Internal Medicine

## 2019-06-25 ENCOUNTER — Ambulatory Visit (INDEPENDENT_AMBULATORY_CARE_PROVIDER_SITE_OTHER): Payer: Medicare Other | Admitting: Internal Medicine

## 2019-06-25 ENCOUNTER — Other Ambulatory Visit: Payer: Self-pay

## 2019-06-25 DIAGNOSIS — I5032 Chronic diastolic (congestive) heart failure: Secondary | ICD-10-CM | POA: Diagnosis not present

## 2019-06-25 DIAGNOSIS — I4819 Other persistent atrial fibrillation: Secondary | ICD-10-CM | POA: Diagnosis not present

## 2019-06-25 HISTORY — DX: Chronic diastolic (congestive) heart failure: I50.32

## 2019-06-25 NOTE — Progress Notes (Signed)
Electrophysiology Office Note   Date:  06/25/2019   ID:  Ross Peter., DOB 10-05-1950, MRN [id number]  PCP:  [facility]  Cardiologist:  Dr Dulce Sellar Primary Electrophysiologist: Hillis Range, MD    CC: afib   History of Present Illness: Ross Lunde. is a 69 y.o. male who presents today for electrophysiology evaluation.   He was diagnosed with atrial fibrillation after presenting to primary care in March with symptoms of SOB.  He was found to have afib with RVR.  He was admitted and managed with rate control and anticoagulation.  He underwent cardioversion in April.  He did not maintain sinus rhythm.  Echo performed at [facility] revealed EF 45-50% with mild LA enlargement, mild to moderate TR. He continues to have fatigue, SOB, and reduced exercise tolerance.  + mild edema (improved with diuresis) Today, he denies symptoms of palpitations, chest pain, orthopnea, PND,  dizziness, presyncope, syncope, bleeding, or neurologic sequela. The patient is tolerating medications without difficulties and is otherwise without complaint today.    Past Medical History:  Diagnosis Date  . Allergic rhinitis   . Asthma   . LPRD (laryngopharyngeal reflux disease)   . Moderate persistent asthma without complication 04/21/2019  . Other allergic rhinitis 04/21/2019  . Persistent atrial fibrillation [facility])    Past Surgical History:  Procedure Laterality Date  . ADENOIDECTOMY    . COLON RESECTION    . HERNIA REPAIR    . SINOSCOPY    . TONSILLECTOMY       Current Outpatient Medications  Medication Sig Dispense Refill  . Acetaminophen (TYLENOL PO) Take 500 mg by mouth as needed.    Marland Kitchen albuterol (VENTOLIN HFA) 108 (90 Base) MCG/ACT inhaler Inhale two puffs every four to six hours as needed for cough or wheeze. 18 g 0  . azelastine (ASTELIN) 0.1 % nasal spray USE TWO SPRAYS IN EACH NOSTRIL TWICE DAILY IF NEEDED 90 mL 1  . budesonide-formoterol (SYMBICORT) 160-4.5 MCG/ACT inhaler  Take 2 puffs twice daily to prevent coughing or wheezing. 3 Inhaler 1  . diazepam (VALIUM) 10 MG tablet Take 5 mg by mouth every 6 (six) hours as needed for anxiety.    Marland Kitchen diltiazem (CARDIZEM) 60 MG tablet Take 1 tablet (60 mg total) by mouth every 6 (six) hours. 120 tablet 3  . ELIQUIS 5 MG TABS tablet Take 1 tablet (5 mg total) by mouth 2 (two) times daily. 60 tablet 3  . hydrocortisone (ANUSOL-HC) 2.5 % rectal cream 1 application daily as needed.    . metoprolol tartrate (LOPRESSOR) 25 MG tablet Take 1 tablet (25 mg total) by mouth in the morning, at noon, in the evening, and at bedtime. 120 tablet 3  . mometasone (NASONEX) 50 MCG/ACT nasal spray Take 1 spray per nostril once or twice daily as directed. 51 g 1  . omeprazole (PRILOSEC) 20 MG capsule Take 1 capsule (20 mg total) by mouth daily. 90 capsule 1  . zafirlukast (ACCOLATE) 20 MG tablet TAKE 1 TABLET BY MOUTH TWICE A DAY 180 tablet 0   No current facility-administered medications for this visit.    Allergies:   Biaxin [clarithromycin], Ceftin [cefuroxime axetil], and Codeine   Social History:  The patient  reports that he has never smoked. He has never used smokeless tobacco. He reports current alcohol use. He reports that he does not use drugs.   Family History:  The patient's family history is not on file. He was adopted.  ROS:  Please see the history of present illness.   All other systems are personally reviewed and negative.    PHYSICAL EXAM: VS:  BP 112/72   Pulse (!) 105   Ht 5\' 11"  (1.803 m)   Wt 177 lb (80.3 kg)   SpO2 96%   BMI 24.69 kg/m  , BMI Body mass index is 24.69 kg/m. GEN: Well nourished, well developed, in no acute distress  HEENT: normal  Neck: no JVD, carotid bruits, or masses Cardiac: iRRR; no murmurs, rubs, or gallops,no edema  Respiratory:  clear to auscultation bilaterally, normal work of breathing GI: soft, nontender, nondistended, + BS MS: no deformity or atrophy  Skin: warm and dry    Neuro:  Strength and sensation are intact Psych: euthymic mood, full affect  EKG:  EKG is ordered today. The ekg ordered today is personally reviewed and shows afib, V rate 105 bpm   Recent Labs: 05/23/2019: BUN 23; Creatinine, Ser 1.23; Hemoglobin 17.6; NT-Pro BNP 911; Platelets 271; Potassium 4.9; Sodium 146  personally reviewed   Lipid Panel  No results found for: CHOL, TRIG, HDL, CHOLHDL, VLDL, LDLCALC, LDLDIRECT personally reviewed   Wt Readings from Last 3 Encounters:  06/25/19 177 lb (80.3 kg)  06/23/19 180 lb 12.8 oz (82 kg)  05/23/19 176 lb 3.2 oz (79.9 kg)      Other studies personally reviewed: Additional studies/ records that were reviewed today include: Dr Oren Binet records  Review of the above records today demonstrates: as above   ASSESSMENT AND PLAN:  1.  Persistent afib The patient has symptomatic, recurrent persistent atrial fibrillation.  Chads2vasc score is 2.  he is anticoagulated with eliquis . Therapeutic strategies for afib including medicine (tikosyn/ amiodarone) and ablation were discussed in detail with the patient today. Risk, benefits, and alternatives to EP study and radiofrequency ablation for afib were also discussed in detail today. These risks include but are not limited to stroke, bleeding, vascular damage, tamponade, perforation, damage to the esophagus, lungs, and other structures, pulmonary vein stenosis, worsening renal function, and death. The patient understands these risk and wishes to proceed.  We will therefore proceed with catheter ablation at the next available time.  Carto, ICE, anesthesia are requested for the procedure.  Will also obtain cardiac CT prior to the procedure to exclude LAA thrombus and further evaluate atrial anatomy.  2. Chronic systolic dysfunction EF 62% I would anticipate that his EF may improve with sinus    Signed, Thompson Grayer, MD  06/25/2019 12:36 PM     [address] [place] [id number] [phone] (office) [phone] (fax)

## 2019-06-25 NOTE — Patient Instructions (Addendum)
Medication Instructions:  Your physician recommends that you continue on your current medications as directed. Please refer to the Current Medication list given to you today.  Labwork: None ordered.  Testing/Procedures: None ordered.  Follow-Up:   Contact me if you decide you would like to be admitted for dofetilide or if you would like an atrial fibrillation ablation  Any Other Special Instructions Will Be Listed Below (If Applicable).  If you need a refill on your cardiac medications before your next appointment, please call your pharmacy.    Cardiac Ablation Cardiac ablation is a procedure to disable (ablate) a small amount of heart tissue in very specific places. The heart has many electrical connections. Sometimes these connections are abnormal and can cause the heart to beat very fast or irregularly. Ablating some of the problem areas can improve the heart rhythm or return it to normal. Ablation may be done for people who:  Have Wolff-Parkinson-White syndrome.  Have fast heart rhythms (tachycardia).  Have taken medicines for an abnormal heart rhythm (arrhythmia) that were not effective or caused side effects.  Have a high-risk heartbeat that may be life-threatening. During the procedure, a small incision is made in the neck or the groin, and a long, thin, flexible tube (catheter) is inserted into the incision and moved to the heart. Small devices (electrodes) on the tip of the catheter will send out electrical currents. A type of X-ray (fluoroscopy) will be used to help guide the catheter and to provide images of the heart. Tell a health care provider about:  Any allergies you have.  All medicines you are taking, including vitamins, herbs, eye drops, creams, and over-the-counter medicines.  Any problems you or family members have had with anesthetic medicines.  Any blood disorders you have.  Any surgeries you have had.  Any medical conditions you have, such as kidney  failure.  Whether you are pregnant or may be pregnant. What are the risks? Generally, this is a safe procedure. However, problems may occur, including:  Infection.  Bruising and bleeding at the catheter insertion site.  Bleeding into the chest, especially into the sac that surrounds the heart. This is a serious complication.  Stroke or blood clots.  Damage to other structures or organs.  Allergic reaction to medicines or dyes.  Need for a permanent pacemaker if the normal electrical system is damaged. A pacemaker is a small computer that sends electrical signals to the heart and helps your heart beat normally.  The procedure not being fully effective. This may not be recognized until months later. Repeat ablation procedures are sometimes required. What happens before the procedure?  Follow instructions from your health care provider about eating or drinking restrictions.  Ask your health care provider about: ? Changing or stopping your regular medicines. This is especially important if you are taking diabetes medicines or blood thinners. ? Taking medicines such as aspirin and ibuprofen. These medicines can thin your blood. Do not take these medicines before your procedure if your health care provider instructs you not to.  Plan to have someone take you home from the hospital or clinic.  If you will be going home right after the procedure, plan to have someone with you for 24 hours. What happens during the procedure?  To lower your risk of infection: ? Your health care team will wash or sanitize their hands. ? Your skin will be washed with soap. ? Hair may be removed from the incision area.  An IV tube will be  inserted into one of your veins.  You will be given a medicine to help you relax (sedative).  The skin on your neck or groin will be numbed.  An incision will be made in your neck or your groin.  A needle will be inserted through the incision and into a large vein  in your neck or groin.  A catheter will be inserted into the needle and moved to your heart.  Dye may be injected through the catheter to help your surgeon see the area of the heart that needs treatment.  Electrical currents will be sent from the catheter to ablate heart tissue in desired areas. There are three types of energy that may be used to ablate heart tissue: ? Heat (radiofrequency energy). ? Laser energy. ? Extreme cold (cryoablation).  When the necessary tissue has been ablated, the catheter will be removed.  Pressure will be held on the catheter insertion area to prevent excessive bleeding.  A bandage (dressing) will be placed over the catheter insertion area. The procedure may vary among health care providers and hospitals. What happens after the procedure?  Your blood pressure, heart rate, breathing rate, and blood oxygen level will be monitored until the medicines you were given have worn off.  Your catheter insertion area will be monitored for bleeding. You will need to lie still for a few hours to ensure that you do not bleed from the catheter insertion area.  Do not drive for 24 hours or as long as directed by your health care provider. Summary  Cardiac ablation is a procedure to disable (ablate) a small amount of heart tissue in very specific places. Ablating some of the problem areas can improve the heart rhythm or return it to normal.  During the procedure, electrical currents will be sent from the catheter to ablate heart tissue in desired areas. This information is not intended to replace advice given to you by your health care provider. Make sure you discuss any questions you have with your health care provider. Document Revised: 07/30/2017 Document Reviewed: 12/27/2015 Elsevier Patient Education  2020 [employer] Dofetilide capsules What is this medicine? DOFETILIDE (doe FET il ide) is an antiarrhythmic drug. It helps make your heart beat regularly. This  medicine also helps to slow rapid heartbeats. This medicine may be used for other purposes; ask your health care provider or pharmacist if you have questions. COMMON BRAND NAME(S): Tikosyn What should I tell my health care provider before I take this medicine? They need to know if you have any of these conditions:  heart disease  history of irregular heartbeat  history of low levels of potassium or magnesium in the blood  kidney disease  liver disease  an unusual or allergic reaction to dofetilide, other medicines, foods, dyes, or preservatives  pregnant or trying to get pregnant  breast-feeding How should I use this medicine? Take this medicine by mouth with a glass of water. Follow the directions on the prescription label. Do not take with grapefruit juice. You can take it with or without food. If it upsets your stomach, take it with food. Take your medicine at regular intervals. Do not take it more often than directed. Do not stop taking except on your doctor's advice. A special MedGuide will be given to you by the pharmacist with each prescription and refill. Be sure to read this information carefully each time. Talk to your pediatrician regarding the use of this medicine in children. Special care may be needed. Overdosage:  If you think you have taken too much of this medicine contact a poison control center or emergency room at once. NOTE: This medicine is only for you. Do not share this medicine with others. What if I miss a dose? If you miss a dose, skip it. Take your next dose at the normal time. Do not take extra or 2 doses at the same time to make up for the missed dose. What may interact with this medicine? Do not take this medicine with any of the following medications:  cimetidine  cisapride  dolutegravir  dronedarone  hydrochlorothiazide  ketoconazole  megestrol  pimozide  prochlorperazine  thioridazine  trimethoprim  verapamil This medicine may  also interact with the following medications:  amiloride  cannabinoids  certain antibiotics like erythromycin or clarithromycin  certain antiviral medicines for HIV or hepatitis  certain medicines for depression, anxiety, or psychotic disorders  digoxin  diltiazem  grapefruit juice  metformin  nefazodone  other medicines that prolong the QT interval (an abnormal heart rhythm)  quinine  triamterene  zafirlukast  ziprasidone This list may not describe all possible interactions. Give your health care provider a list of all the medicines, herbs, non-prescription drugs, or dietary supplements you use. Also tell them if you smoke, drink alcohol, or use illegal drugs. Some items may interact with your medicine. What should I watch for while using this medicine? Your condition will be monitored carefully while you are receiving this medicine. What side effects may I notice from receiving this medicine? Side effects that you should report to your doctor or health care professional as soon as possible:  allergic reactions like skin rash, itching or hives, swelling of the face, lips, or tongue  breathing problems  chest pain or chest tightness  dizziness  signs and symptoms of a dangerous change in heartbeat or heart rhythm like chest pain; dizziness; fast or irregular heartbeat; palpitations; feeling faint or lightheaded, falls; breathing problems  signs and symptoms of electrolyte imbalance like severe diarrhea, unusual sweating, vomiting, loss of appetite, increased thirst  swelling of the ankles, legs, or feet  tingling, numbness in the hands or feet Side effects that usually do not require medical attention (report to your doctor or health care professional if they continue or are bothersome):  diarrhea  general ill feeling or flu-like symptoms  headache  nausea  trouble sleeping  stomach pain This list may not describe all possible side effects. Call your  doctor for medical advice about side effects. You may report side effects to FDA at 1-800-FDA-1088. Where should I keep my medicine? Keep out of the reach of children. Store at room temperature between 15 and 30 degrees C (59 and 86 degrees F). Throw away any unused medicine after the expiration date. NOTE: This sheet is a summary. It may not cover all possible information. If you have questions about this medicine, talk to your doctor, pharmacist, or health care provider.  2020 Elsevier/Gold Standard (2018-01-28 10:18:48)

## 2019-06-26 ENCOUNTER — Telehealth: Payer: Self-pay | Admitting: Internal Medicine

## 2019-06-26 DIAGNOSIS — I4819 Other persistent atrial fibrillation: Secondary | ICD-10-CM

## 2019-06-26 DIAGNOSIS — I4891 Unspecified atrial fibrillation: Secondary | ICD-10-CM

## 2019-06-26 NOTE — Telephone Encounter (Signed)
New Message:    Please call, pt said he wanted to talk to you about his procedure.

## 2019-06-26 NOTE — Telephone Encounter (Signed)
Spoke with pt and after weighing his options, he has decided to move forward with the ablation.  Pt states that he is available any day in May or June for the procedure.  Advised pt once Boneta Lucks, RN returns to the office and gets everything set up, she will be in contact to go over instructions.  Pt appreciative for call.

## 2019-06-27 NOTE — Telephone Encounter (Signed)
Patient called to check on status of scheduling, nurse advised to give him possible available dates of 6/22, 6/24 and 6/29.  She will contact him on Monday to schedule his procedure.  Patient verbalized understanding.

## 2019-06-30 NOTE — Telephone Encounter (Signed)
Call returned to Pt.  Pt scheduled for afib ablation on August 12, 2019 at 7:30 am.  Labs 07/29/19 and will meet with Pt to go over instruction letters.  Pt lives in [place]-will try to coordinate cardiac CT and covid test on June 18.   Work up complete

## 2019-07-08 ENCOUNTER — Other Ambulatory Visit (HOSPITAL_COMMUNITY): Payer: Medicare Other

## 2019-07-18 ENCOUNTER — Telehealth: Payer: Self-pay | Admitting: Allergy and Immunology

## 2019-07-18 ENCOUNTER — Other Ambulatory Visit: Payer: Self-pay | Admitting: Allergy and Immunology

## 2019-07-18 MED ORDER — OMEPRAZOLE 20 MG PO CPDR: 20.0000 mg | DELAYED_RELEASE_CAPSULE | Freq: Every day | ORAL | 0 refills | Status: DC

## 2019-07-18 MED ORDER — SYMBICORT 160-4.5 MCG/ACT IN AERO: INHALATION_SPRAY | RESPIRATORY_TRACT | 0 refills | Status: DC

## 2019-07-18 MED ORDER — MOMETASONE FUROATE 50 MCG/ACT NA SUSP: NASAL | 0 refills | Status: DC

## 2019-07-18 MED ORDER — AZELASTINE HCL 0.1 % NA SOLN: NASAL | 0 refills | Status: DC

## 2019-07-18 MED ORDER — ZAFIRLUKAST 20 MG PO TABS: 20.0000 mg | ORAL_TABLET | Freq: Two times a day (BID) | ORAL | 0 refills | Status: DC

## 2019-07-18 NOTE — Telephone Encounter (Signed)
Is it ok to refill this medication? I did not see mention of this in your recent office notes.

## 2019-07-18 NOTE — Telephone Encounter (Signed)
Ross Robinson called in and states his prescription refills were never sent in.  He would like refills called in for Astelin, Symbicort, Omeprazole, Nasonex, and Accolate.  He would like those sent in to [facility] on [facility].

## 2019-07-18 NOTE — Telephone Encounter (Signed)
ERX refills sent to [facility] as requested.

## 2019-07-29 ENCOUNTER — Other Ambulatory Visit: Payer: Medicare Other

## 2019-07-29 ENCOUNTER — Other Ambulatory Visit: Payer: Self-pay

## 2019-07-29 DIAGNOSIS — I4819 Other persistent atrial fibrillation: Secondary | ICD-10-CM

## 2019-07-30 LAB — CBC WITH DIFFERENTIAL/PLATELET
Basophils Absolute: 0.1 10*3/uL (ref 0.0–0.2)
Basos: 1 %
EOS (ABSOLUTE): 0.5 10*3/uL — ABNORMAL HIGH (ref 0.0–0.4)
Eos: 8 %
Hematocrit: 47.1 % (ref 37.5–51.0)
Hemoglobin: 16.6 g/dL (ref 13.0–17.7)
Immature Grans (Abs): 0 10*3/uL (ref 0.0–0.1)
Immature Granulocytes: 0 %
Lymphocytes Absolute: 2.3 10*3/uL (ref 0.7–3.1)
Lymphs: 34 %
MCH: 34.3 pg — ABNORMAL HIGH (ref 26.6–33.0)
MCHC: 35.2 g/dL (ref 31.5–35.7)
MCV: 97 fL (ref 79–97)
Monocytes Absolute: 0.8 10*3/uL (ref 0.1–0.9)
Monocytes: 11 %
Neutrophils Absolute: 3.2 10*3/uL (ref 1.4–7.0)
Neutrophils: 46 %
Platelets: 226 10*3/uL (ref 150–450)
RBC: 4.84 x10E6/uL (ref 4.14–5.80)
RDW: 13.4 % (ref 11.6–15.4)
WBC: 6.9 10*3/uL (ref 3.4–10.8)

## 2019-07-30 LAB — BASIC METABOLIC PANEL
BUN/Creatinine Ratio: 24 (ref 10–24)
BUN: 24 mg/dL (ref 8–27)
CO2: 23 mmol/L (ref 20–29)
Calcium: 8.9 mg/dL (ref 8.6–10.2)
Chloride: 107 mmol/L — ABNORMAL HIGH (ref 96–106)
Creatinine, Ser: 1.02 mg/dL (ref 0.76–1.27)
GFR calc Af Amer: 88 mL/min/{1.73_m2} (ref >59)
GFR calc non Af Amer: 76 mL/min/{1.73_m2} (ref >59)
Glucose: 83 mg/dL (ref 65–99)
Potassium: 4.1 mmol/L (ref 3.5–5.2)
Sodium: 145 mmol/L — ABNORMAL HIGH (ref 134–144)

## 2019-08-06 ENCOUNTER — Telehealth (HOSPITAL_COMMUNITY): Payer: Self-pay | Admitting: *Deleted

## 2019-08-06 NOTE — Telephone Encounter (Signed)

## 2019-08-08 ENCOUNTER — Other Ambulatory Visit (HOSPITAL_COMMUNITY)
Admission: RE | Admit: 2019-08-08 | Discharge: 2019-08-08 | Disposition: A | Discharge status: Home / Self Care | Payer: Medicare Other | Source: Ambulatory Visit | Attending: Internal Medicine | Admitting: Internal Medicine

## 2019-08-08 ENCOUNTER — Ambulatory Visit (HOSPITAL_COMMUNITY)
Admission: RE | Admit: 2019-08-08 | Discharge: 2019-08-08 | Disposition: A | Discharge status: Home / Self Care | Payer: Medicare Other | Source: Ambulatory Visit | Attending: Internal Medicine | Admitting: Internal Medicine

## 2019-08-08 DIAGNOSIS — Z20822 Contact with and (suspected) exposure to covid-19: Secondary | ICD-10-CM | POA: Insufficient documentation

## 2019-08-08 DIAGNOSIS — Z01818 Encounter for other preprocedural examination: Secondary | ICD-10-CM | POA: Diagnosis present

## 2019-08-08 DIAGNOSIS — I4891 Unspecified atrial fibrillation: Secondary | ICD-10-CM | POA: Insufficient documentation

## 2019-08-08 LAB — SARS CORONAVIRUS 2 (TAT 6-24 HRS): SARS Coronavirus 2: NEGATIVE

## 2019-08-08 IMAGING — CT CT HEART MORPH/PULM VEIN W/ CM & W/O CA SCORE
3 of 6 series · 10 of 20 positions shown, 11 images · IV contrast (APPLIED)
Comparison: None
COMPARISON: None.
COMPARISON: None

Addendum:
CLINICAL DATA: Pre Ablation

EXAM:
Cardiac Gated CTA
TECHNIQUE: The patient was scanned on a Siemens Force [REDACTED]ice scanner. Gantry
rotation speed was 250 msec with a temporal resolution of 66 msec. A
prospective scan was triggered in the ascending thoracic aorta at
140 HU's Data sets were reconstructed with full mA between 35% and
75% of the R-R interval Images were reviewed using VRT, MIP and MPR
modes. Double oblique images were used to measure the PV diameter
and areas. The patient received 80 cc of contrast at 5 cc/sec
CONTRAST:  Isovue 370 total 80 cc

[Series 6: best diast · axial · 0.39mm/px · z∈[-389,-314]mm · 4 of 316 slices shown, 5 images]
[im 64/316  vessel]
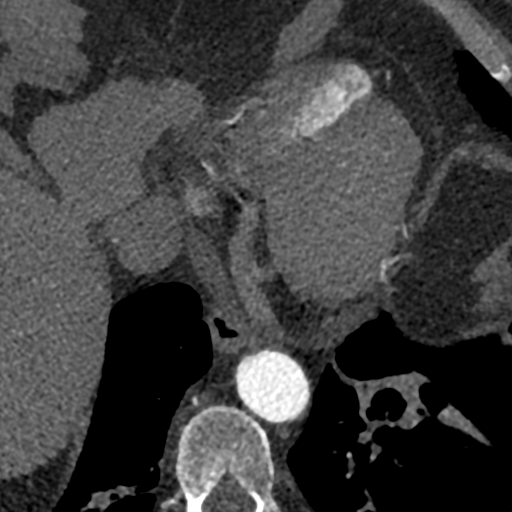
[im 64/316  lung]
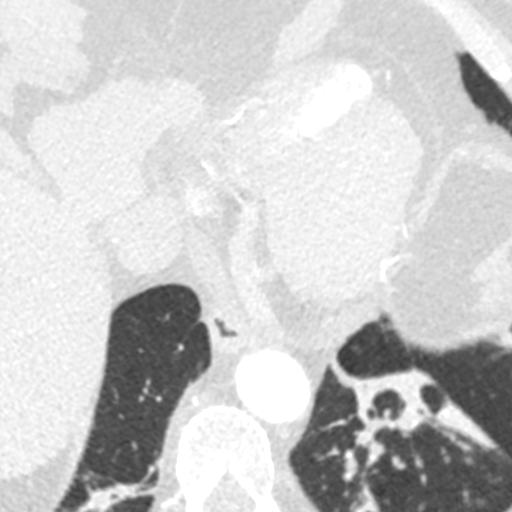
[im 127/316  vessel]
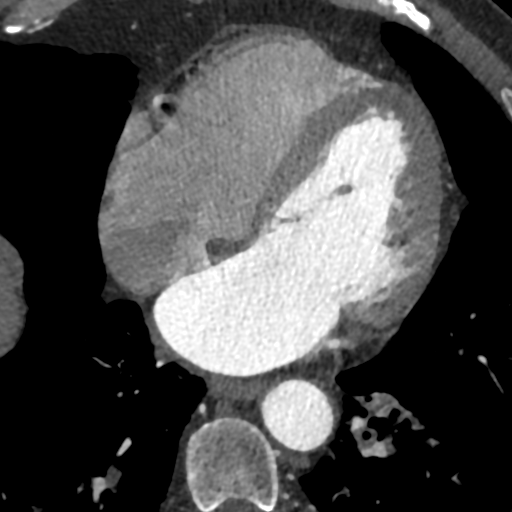
[im 190/316  vessel]
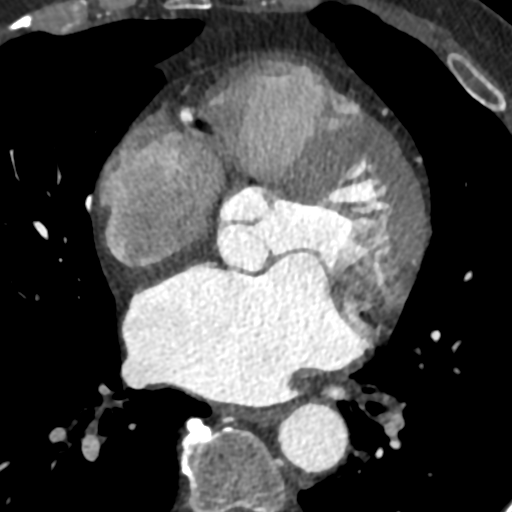
[im 253/316  vessel]
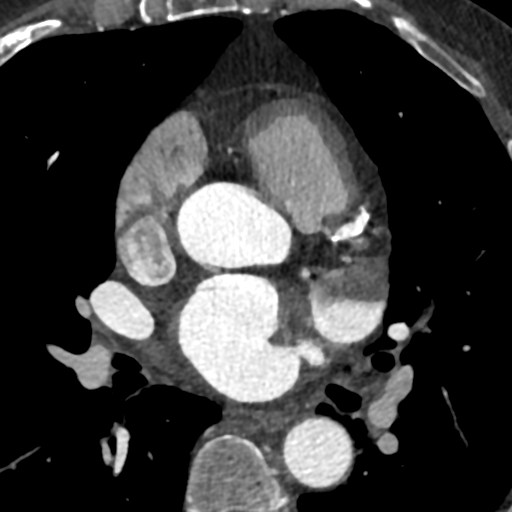

[Series 7: best syst · axial · 0.39mm/px · z∈[-389,-314]mm · 4 of 316 slices shown]
[im 64/316  vessel]
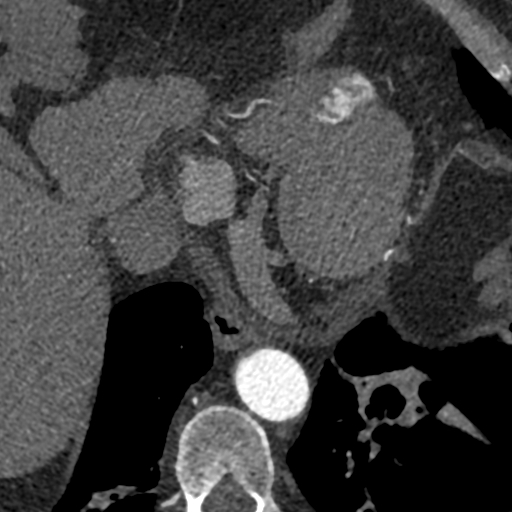
[im 127/316  vessel]
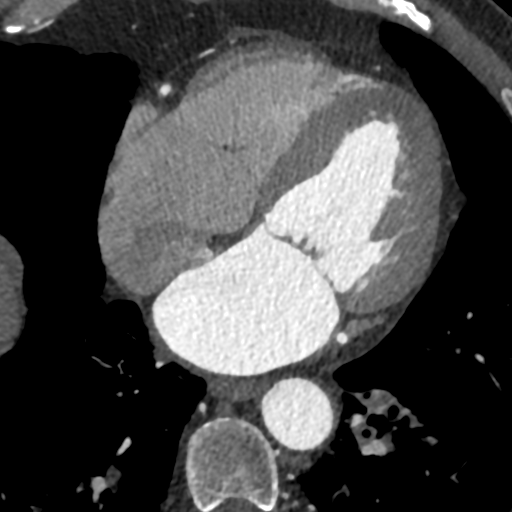
[im 190/316  vessel]
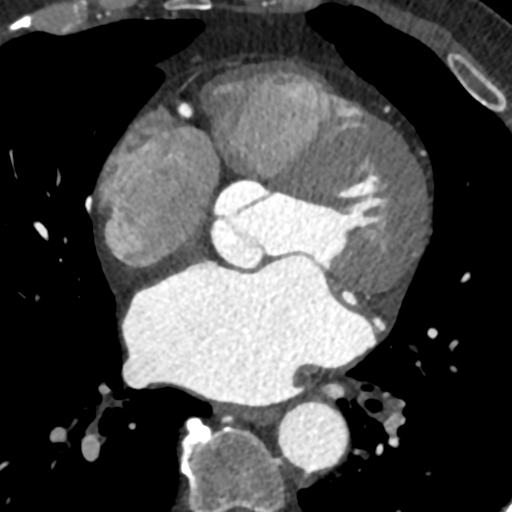
[im 253/316  vessel]
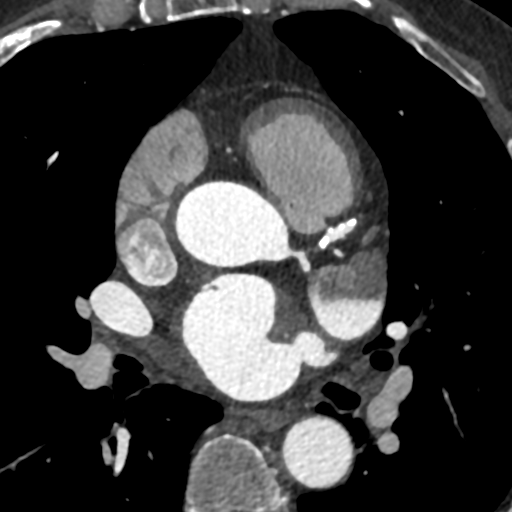

[Series 14: pv delay · axial · portal-venous · 0.39mm/px · z∈[-350,-319]mm · 2 of 184 slices shown]
[im 62/184  vessel]
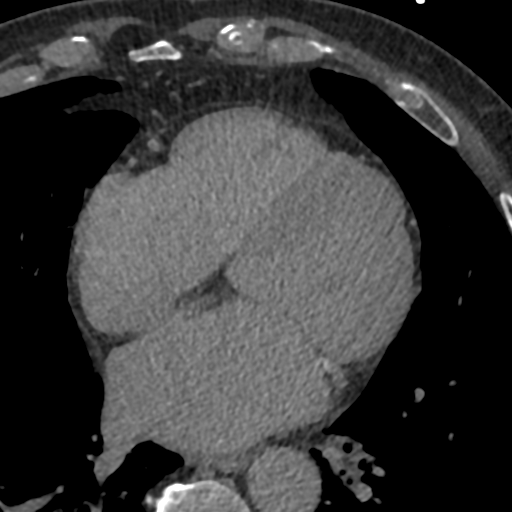
[im 123/184  vessel]
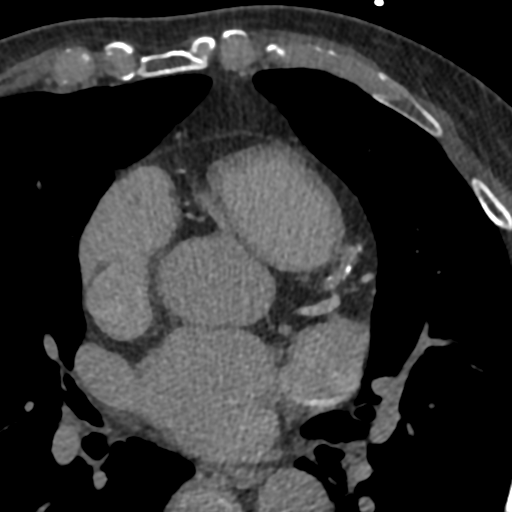

[10 of 20 positions shown; findings below may reference images not displayed]

FINDINGS: Calcium score: Significant 3 vessel calcium particularly in the
proximal and mid LAD

Mild LAE. Normal RA. PFO present. No pericardial effusion Mild
aortic root dilation 3.8 cm Normal PV anatomy with no anomaly Small
elliptical LLPV Significant

Layering of contrast in EBADAT on initial images. No EBADAT thrombus seen
on delay images

Coronary Arteries: Normal origins Right dominant Sub-optimal
visualization due to rapid afib HR 103 bpm and no nitro given.
Appears to have non obstructive calcific disease in RCA/Circumflex
1-24% LAD more heavily calcified 25-49% mixed plaque in proximal and
mid vessel

LUPV:  Ostium 19.9 mm    area 3.2 cm2

LLPV:   Ostium 17.8 mm elliptical small   area 1.1 cm2

RUPV:  Ostium 20.5 mm   area 4.3 cm 2

RLPV:  Ostium 15.9 mm   area 1.8 cm2
IMPRESSION: 1.  Mild LAE no EBADAT thrombus on delayed images

2.  PFO present

3.  Normal PV anatomy with no anomaly small elliptical LLPV

4.  Mild aortic root dilatation 3.8 cm

5.  No pericardial effusion

6. Calcium score 946 which is 88 th percentile for age and sex.
Consider f/u perfusion study Sub optimal exam for coronary analysis
due to rapid afib and no nitro Moderate appearing 25-49% proximal /
mid LAD stenosis and 1-24% RCA/Circumflex stenosis

EXAM:
OVER-READ INTERPRETATION  CT CHEST

The following report is an over-read performed by radiologist Dr.
over-read does not include interpretation of cardiac or coronary
anatomy or pathology. The coronary calcium score and cardiac CTA
interpretation by the cardiologist is attached.
FINDINGS: Areas of mucoid impaction, thickening of the peribronchovascular
interstitium, regional architectural distortion and volume loss
noted in the lower lobes of the lungs bilaterally, likely sequela of
recurrent aspiration. Within the visualized portions of the thorax
there are no suspicious appearing pulmonary nodules or masses, there
is no acute consolidative airspace disease, no pleural effusions, no
pneumothorax and no lymphadenopathy. Visualized portions of the
upper abdomen are unremarkable. There are no aggressive appearing
lytic or blastic lesions noted in the visualized portions of the
skeleton.
IMPRESSION: 1. Findings in the lungs concerning for probable recurrent
aspiration, as detailed above.

*** End of Addendum ***
FINDINGS: Calcium score: Significant 3 vessel calcium particularly in the
proximal and mid LAD

Mild LAE. Normal RA. PFO present. No pericardial effusion Mild
aortic root dilation 3.8 cm Normal PV anatomy with no anomaly Small
elliptical LLPV Significant

Layering of contrast in EBADAT on initial images. No EBADAT thrombus seen
on delay images

Coronary Arteries: Normal origins Right dominant Sub-optimal
visualization due to rapid afib HR 103 bpm and no nitro given.
Appears to have non obstructive calcific disease in RCA/Circumflex
1-24% LAD more heavily calcified 25-49% mixed plaque in proximal and
mid vessel

LUPV:  Ostium 19.9 mm    area 3.2 cm2

LLPV:   Ostium 17.8 mm elliptical small   area 1.1 cm2

RUPV:  Ostium 20.5 mm   area 4.3 cm 2

RLPV:  Ostium 15.9 mm   area 1.8 cm2
IMPRESSION: 1.  Mild LAE no EBADAT thrombus on delayed images

2.  PFO present

3.  Normal PV anatomy with no anomaly small elliptical LLPV

4.  Mild aortic root dilatation 3.8 cm

5.  No pericardial effusion

6. Calcium score 946 which is 88 th percentile for age and sex.
Consider f/u perfusion study Sub optimal exam for coronary analysis
due to rapid afib and no nitro Moderate appearing 25-49% proximal /
mid LAD stenosis and 1-24% RCA/Circumflex stenosis

## 2019-08-08 MED ORDER — IOHEXOL 350 MG/ML SOLN
80.0000 mL | Freq: Once | INTRAVENOUS | Status: AC | PRN
  Administered 2019-08-08: 80 mL via INTRAVENOUS

## 2019-08-11 ENCOUNTER — Telehealth: Payer: Self-pay | Admitting: Internal Medicine

## 2019-08-11 NOTE — Telephone Encounter (Signed)
Patient states he has some questions about his procedure tomorrow. He would like to know if he will need insurance card or ssn card. He also states the instructions say not to skip eliquis, but also says do not take medication. He would like to know how he should take his medications especially his eliquis.

## 2019-08-11 NOTE — Telephone Encounter (Signed)
Eliquis instructions given to pt - informed that he will take his p.m. dose tonight of Eliquis.  Aware he will not take it in the morning, prior to procedure, as he is not to take any medication in the morning. Informed he should bring his insurance/medicare card w/ him to hospital tomorrow. Patient verbalized understanding and agreeable to plan.

## 2019-08-11 NOTE — Anesthesia Preprocedure Evaluation (Addendum)
Anesthesia Evaluation  Patient identified by MRN, date of birth, ID band Patient awake    Reviewed: Allergy & Precautions, NPO status , Patient's Chart, lab work & pertinent test results, reviewed documented beta blocker date and time   History of Anesthesia Complications Negative for: history of anesthetic complications  Airway Mallampati: II  TM Distance: >3 FB Neck ROM: Full    Dental  (+) Dental Advisory Given   Pulmonary asthma ,    Pulmonary exam normal        Cardiovascular +CHF  + dysrhythmias Atrial Fibrillation + Valvular Problems/Murmurs  Rhythm:Irregular Rate:Tachycardia   '21 TTE - EF 45-50%, global hypokinesis. Moderately enlarged RV. Mildly dilated LA. Mild MR. Mild-mod TR.     Neuro/Psych negative neurological ROS  negative psych ROS   GI/Hepatic negative GI ROS, Neg liver ROS,   Endo/Other  negative endocrine ROS  Renal/GU negative Renal ROS     Musculoskeletal negative musculoskeletal ROS (+)   Abdominal   Peds  Hematology negative hematology ROS (+)   Anesthesia Other Findings Covid test negative   Reproductive/Obstetrics                            Anesthesia Physical Anesthesia Plan  ASA: III  Anesthesia Plan: General   Post-op Pain Management:    Induction: Intravenous  PONV Risk Score and Plan: 2 and Treatment may vary due to age or medical condition, Ondansetron and Dexamethasone  Airway Management Planned: Oral ETT  Additional Equipment: None  Intra-op Plan:   Post-operative Plan: Extubation in OR  Informed Consent: I have reviewed the patients History and Physical, chart, labs and discussed the procedure including the risks, benefits and alternatives for the proposed anesthesia with the patient or authorized representative who has indicated his/her understanding and acceptance.     Dental advisory given  Plan Discussed with: CRNA and  Anesthesiologist  Anesthesia Plan Comments:        Anesthesia Quick Evaluation

## 2019-08-12 ENCOUNTER — Other Ambulatory Visit: Payer: Self-pay

## 2019-08-12 ENCOUNTER — Encounter (HOSPITAL_COMMUNITY)
Admission: RE | Disposition: A | Discharge status: Home / Self Care | Payer: Self-pay | Source: Home / Self Care | Attending: Internal Medicine

## 2019-08-12 ENCOUNTER — Ambulatory Visit (HOSPITAL_COMMUNITY)
Admission: RE | Admit: 2019-08-12 | Discharge: 2019-08-12 | Disposition: A | Discharge status: Home / Self Care | Payer: Medicare Other | Attending: Internal Medicine | Admitting: Internal Medicine

## 2019-08-12 ENCOUNTER — Ambulatory Visit (HOSPITAL_COMMUNITY): Payer: Medicare Other | Admitting: Anesthesiology

## 2019-08-12 DIAGNOSIS — Z881 Allergy status to other antibiotic agents status: Secondary | ICD-10-CM | POA: Diagnosis not present

## 2019-08-12 DIAGNOSIS — Z7951 Long term (current) use of inhaled steroids: Secondary | ICD-10-CM | POA: Diagnosis not present

## 2019-08-12 DIAGNOSIS — Z885 Allergy status to narcotic agent status: Secondary | ICD-10-CM | POA: Insufficient documentation

## 2019-08-12 DIAGNOSIS — K219 Gastro-esophageal reflux disease without esophagitis: Secondary | ICD-10-CM | POA: Diagnosis not present

## 2019-08-12 DIAGNOSIS — J4540 Moderate persistent asthma, uncomplicated: Secondary | ICD-10-CM | POA: Diagnosis not present

## 2019-08-12 DIAGNOSIS — Z79899 Other long term (current) drug therapy: Secondary | ICD-10-CM | POA: Insufficient documentation

## 2019-08-12 DIAGNOSIS — Z7901 Long term (current) use of anticoagulants: Secondary | ICD-10-CM | POA: Diagnosis not present

## 2019-08-12 DIAGNOSIS — I4819 Other persistent atrial fibrillation: Secondary | ICD-10-CM | POA: Diagnosis present

## 2019-08-12 HISTORY — PX: ATRIAL FIBRILLATION ABLATION: EP1191

## 2019-08-12 LAB — POCT ACTIVATED CLOTTING TIME
Activated Clotting Time: 235 seconds
Activated Clotting Time: 268 seconds

## 2019-08-12 SURGERY — ATRIAL FIBRILLATION ABLATION
Anesthesia: General

## 2019-08-12 MED ORDER — SODIUM CHLORIDE 0.9% FLUSH: 3.0000 mL | Freq: Two times a day (BID) | INTRAVENOUS | Status: DC

## 2019-08-12 MED ORDER — DEXAMETHASONE SODIUM PHOSPHATE 10 MG/ML IJ SOLN
INTRAMUSCULAR | Status: DC | PRN
  Administered 2019-08-12: 10 mg via INTRAVENOUS

## 2019-08-12 MED ORDER — ONDANSETRON HCL 4 MG/2ML IJ SOLN: 4.0000 mg | Freq: Four times a day (QID) | INTRAMUSCULAR | Status: DC | PRN

## 2019-08-12 MED ORDER — APIXABAN 5 MG PO TABS
5.0000 mg | ORAL_TABLET | Freq: Once | ORAL | Status: AC
  Administered 2019-08-12: 5 mg via ORAL
  Filled 2019-08-12: qty 1

## 2019-08-12 MED ORDER — SODIUM CHLORIDE 0.9% FLUSH: 3.0000 mL | INTRAVENOUS | Status: DC | PRN

## 2019-08-12 MED ORDER — ONDANSETRON HCL 4 MG/2ML IJ SOLN
INTRAMUSCULAR | Status: DC | PRN
  Administered 2019-08-12: 4 mg via INTRAVENOUS

## 2019-08-12 MED ORDER — ACETAMINOPHEN 325 MG PO TABS
650.0000 mg | ORAL_TABLET | ORAL | Status: DC | PRN
  Filled 2019-08-12: qty 2

## 2019-08-12 MED ORDER — HEPARIN SODIUM (PORCINE) 1000 UNIT/ML IJ SOLN
INTRAMUSCULAR | Status: AC
  Filled 2019-08-12: qty 1

## 2019-08-12 MED ORDER — HEPARIN SODIUM (PORCINE) 1000 UNIT/ML IJ SOLN
INTRAMUSCULAR | Status: DC | PRN
  Administered 2019-08-12 (×2): 5000 [IU] via INTRAVENOUS

## 2019-08-12 MED ORDER — SODIUM CHLORIDE 0.9 % IV SOLN: 250.0000 mL | INTRAVENOUS | Status: DC | PRN

## 2019-08-12 MED ORDER — LIDOCAINE 2% (20 MG/ML) 5 ML SYRINGE
INTRAMUSCULAR | Status: DC | PRN
  Administered 2019-08-12: 100 mg via INTRAVENOUS

## 2019-08-12 MED ORDER — PROTAMINE SULFATE 10 MG/ML IV SOLN
INTRAVENOUS | Status: DC | PRN
  Administered 2019-08-12: 30 mg via INTRAVENOUS
  Administered 2019-08-12: 10 mg via INTRAVENOUS

## 2019-08-12 MED ORDER — ROCURONIUM BROMIDE 10 MG/ML (PF) SYRINGE
PREFILLED_SYRINGE | INTRAVENOUS | Status: DC | PRN
  Administered 2019-08-12: 50 mg via INTRAVENOUS

## 2019-08-12 MED ORDER — HEPARIN (PORCINE) IN NACL 1000-0.9 UT/500ML-% IV SOLN
INTRAVENOUS | Status: AC
  Filled 2019-08-12: qty 500

## 2019-08-12 MED ORDER — FENTANYL CITRATE (PF) 250 MCG/5ML IJ SOLN
INTRAMUSCULAR | Status: DC | PRN
  Administered 2019-08-12: 100 ug via INTRAVENOUS

## 2019-08-12 MED ORDER — SODIUM CHLORIDE 0.9 % IV SOLN: INTRAVENOUS | Status: DC

## 2019-08-12 MED ORDER — PROPOFOL 10 MG/ML IV BOLUS
INTRAVENOUS | Status: DC | PRN
  Administered 2019-08-12: 150 mg via INTRAVENOUS

## 2019-08-12 MED ORDER — PHENYLEPHRINE HCL-NACL 10-0.9 MG/250ML-% IV SOLN
INTRAVENOUS | Status: DC | PRN
  Administered 2019-08-12: 30 ug/min via INTRAVENOUS

## 2019-08-12 MED ORDER — SUGAMMADEX SODIUM 200 MG/2ML IV SOLN
INTRAVENOUS | Status: DC | PRN
  Administered 2019-08-12: 200 mg via INTRAVENOUS

## 2019-08-12 MED ORDER — HEPARIN SODIUM (PORCINE) 1000 UNIT/ML IJ SOLN
INTRAMUSCULAR | Status: DC | PRN
  Administered 2019-08-12: 14000 [IU] via INTRAVENOUS

## 2019-08-12 SURGICAL SUPPLY — 18 items
BLANKET WARM UNDERBOD FULL ACC (MISCELLANEOUS) ×3 IMPLANT
CATH MAPPNG PENTARAY F 2-6-2MM (CATHETERS) ×1 IMPLANT
CATH SMTCH THERMOCOOL SF DF (CATHETERS) ×3 IMPLANT
CATH SOUNDSTAR ECO 8FR (CATHETERS) ×3 IMPLANT
CATH WEBSTER BI DIR CS D-F CRV (CATHETERS) ×3 IMPLANT
COVER SWIFTLINK CONNECTOR (BAG) ×3 IMPLANT
DEVICE CLOSURE PERCLS PRGLD 6F (VASCULAR PRODUCTS) ×4 IMPLANT
NEEDLE BAYLIS TRANSSEPTAL 71CM (NEEDLE) ×3 IMPLANT
PACK EP LATEX FREE (CUSTOM PROCEDURE TRAY) ×2
PACK EP LF (CUSTOM PROCEDURE TRAY) ×1 IMPLANT
PAD PRO RADIOLUCENT 2001M-C (PAD) ×3 IMPLANT
PATCH CARTO3 (PAD) ×3 IMPLANT
PENTARAY F 2-6-2MM (CATHETERS) ×3
PERCLOSE PROGLIDE 6F (VASCULAR PRODUCTS) ×12
SHEATH PINNACLE 7F 10CM (SHEATH) ×6 IMPLANT
SHEATH PINNACLE 9F 10CM (SHEATH) ×3 IMPLANT
SHEATH SWARTZ TS SL2 63CM 8.5F (SHEATH) ×3 IMPLANT
TUBING SMART ABLATE COOLFLOW (TUBING) ×3 IMPLANT

## 2019-08-12 NOTE — H&P (Signed)
CC: afib   History of Present Illness: Ross Robinson. is a 69 y.o. male who presents today for afib ablation.   He was diagnosed with atrial fibrillation after presenting to primary care in March with symptoms of SOB.  He was found to have afib with RVR.  He was admitted and managed with rate control and anticoagulation.  He underwent cardioversion in April.  He did not maintain sinus rhythm.  Echo performed at [name] revealed EF 45-50% with mild LA enlargement, mild to moderate TR. He continues to have fatigue, SOB, and reduced exercise tolerance.  + mild edema (improved with diuresis) Today, he denies symptoms of palpitations, chest pain, orthopnea, PND,  dizziness, presyncope, syncope, bleeding, or neurologic sequela. The patient is tolerating medications without difficulties and is otherwise without complaint today.        Past Medical History:  Diagnosis Date  . Allergic rhinitis   . Asthma   . LPRD (laryngopharyngeal reflux disease)   . Moderate persistent asthma without complication 02/29/6220  . Other allergic rhinitis 04/21/2019  . Persistent atrial fibrillation [name])         Past Surgical History:  Procedure Laterality Date  . ADENOIDECTOMY    . COLON RESECTION    . HERNIA REPAIR    . SINOSCOPY    . TONSILLECTOMY             Current Outpatient Medications  Medication Sig Dispense Refill  . Acetaminophen (TYLENOL PO) Take 500 mg by mouth as needed.    Marland Kitchen albuterol (VENTOLIN HFA) 108 (90 Base) MCG/ACT inhaler Inhale two puffs every four to six hours as needed for cough or wheeze. 18 g 0  . azelastine (ASTELIN) 0.1 % nasal spray USE TWO SPRAYS IN EACH NOSTRIL TWICE DAILY IF NEEDED 90 mL 1  . budesonide-formoterol (SYMBICORT) 160-4.5 MCG/ACT inhaler Take 2 puffs twice daily to prevent coughing or wheezing. 3 Inhaler 1  . diazepam (VALIUM) 10 MG tablet Take 5 mg by mouth every 6 (six) hours as needed for anxiety.    Marland Kitchen diltiazem (CARDIZEM) 60 MG tablet  Take 1 tablet (60 mg total) by mouth every 6 (six) hours. 120 tablet 3  . ELIQUIS 5 MG TABS tablet Take 1 tablet (5 mg total) by mouth 2 (two) times daily. 60 tablet 3  . hydrocortisone (ANUSOL-HC) 2.5 % rectal cream 1 application daily as needed.    . metoprolol tartrate (LOPRESSOR) 25 MG tablet Take 1 tablet (25 mg total) by mouth in the morning, at noon, in the evening, and at bedtime. 120 tablet 3  . mometasone (NASONEX) 50 MCG/ACT nasal spray Take 1 spray per nostril once or twice daily as directed. 51 g 1  . omeprazole (PRILOSEC) 20 MG capsule Take 1 capsule (20 mg total) by mouth daily. 90 capsule 1  . zafirlukast (ACCOLATE) 20 MG tablet TAKE 1 TABLET BY MOUTH TWICE A DAY 180 tablet 0   No current facility-administered medications for this visit.    Allergies:   Biaxin [clarithromycin], Ceftin [cefuroxime axetil], and Codeine   Social History:  The patient  reports that he has never smoked. He has never used smokeless tobacco. He reports current alcohol use. He reports that he does not use drugs.   Family History:  The patient's family history is not on file. He was adopted.    ROS:  Please see the history of present illness.   All other systems are personally reviewed and negative.    Vitals:  08/12/19 0549  BP: (!) 135/103  Pulse: (!) 104  Resp: 14  Temp: 98.1 F (36.7 C)  SpO2: 98%    GEN: Well nourished, well developed, in no acute distress  HEENT: normal  Neck: no JVD, carotid bruits, or masses Cardiac: iRRR; no murmurs, rubs, or gallops,no edema  Respiratory:  clear to auscultation bilaterally, normal work of breathing GI: soft, nontender, nondistended, + BS MS: no deformity or atrophy  Skin: warm and dry  Neuro:  Strength and sensation are intact Psych: euthymic mood, full affect   Recent Labs: 05/23/2019: BUN 23; Creatinine, Ser 1.23; Hemoglobin 17.6; NT-Pro BNP 911; Platelets 271; Potassium 4.9; Sodium 146  personally reviewed   Lipid Panel   Labs (Brief)  No results found for: CHOL, TRIG, HDL, CHOLHDL, VLDL, LDLCALC, LDLDIRECT   personally reviewed      Wt Readings from Last 3 Encounters:  06/25/19 177 lb (80.3 kg)  06/23/19 180 lb 12.8 oz (82 kg)  05/23/19 176 lb 3.2 oz (79.9 kg)      Other studies personally reviewed: Additional studies/ records that were reviewed today include: Dr Ross Robinson records  Review of the above records today demonstrates: as above   ASSESSMENT AND PLAN:  1.  Persistent afib The patient has symptomatic, recurrent persistent atrial fibrillation.  Chads2vasc score is 2.  he is anticoagulated with eliquis .  Risk, benefits, and alternatives to EP study and radiofrequency ablation for afib were also discussed in detail today. These risks include but are not limited to stroke, bleeding, vascular damage, tamponade, perforation, damage to the esophagus, lungs, and other structures, pulmonary vein stenosis, worsening renal function, and death. The patient understands these risk and wishes to proceed.    He reports compliance with eliquis without interruption.  Cardiac CT reviewed with him at length today.  I have advises that he show the noncardiac findings to his PCP.  Ross Range MD, [facility] 08/12/2019 7:00 AM

## 2019-08-12 NOTE — Anesthesia Procedure Notes (Signed)
Procedure Name: Intubation Date/Time: 08/12/2019 7:43 AM Performed by: Griffin Dakin, CRNA Pre-anesthesia Checklist: Patient identified, Emergency Drugs available, Suction available and Patient being monitored Patient Re-evaluated:Patient Re-evaluated prior to induction Oxygen Delivery Method: Circle system utilized Preoxygenation: Pre-oxygenation with 100% oxygen Induction Type: IV induction Ventilation: Mask ventilation without difficulty and Oral airway inserted - appropriate to patient size Laryngoscope Size: Mac and 4 Grade View: Grade II Tube type: Oral Tube size: 7.5 mm Number of attempts: 1 Airway Equipment and Method: Stylet and Oral airway Placement Confirmation: ETT inserted through vocal cords under direct vision,  positive ETCO2 and breath sounds checked- equal and bilateral Secured at: 23 cm Tube secured with: Tape Dental Injury: Teeth and Oropharynx as per pre-operative assessment

## 2019-08-12 NOTE — Transfer of Care (Signed)
Immediate Anesthesia Transfer of Care Note  Patient: Ross Robinson.  Procedure(s) Performed: ATRIAL FIBRILLATION ABLATION (N/A )  Patient Location: Cath Lab  Anesthesia Type:General  Level of Consciousness: awake, alert  and oriented  Airway & Oxygen Therapy: Patient Spontanous Breathing  Post-op Assessment: Report given to RN and Post -op Vital signs reviewed and stable  Post vital signs: Reviewed and stable  Last Vitals:  Vitals Value Taken Time  BP    Temp    Pulse    Resp    SpO2      Last Pain:  Vitals:   08/12/19 0556  TempSrc:   PainSc: 0-No pain      Patients Stated Pain Goal: 3 (08/12/19 0556)  Complications: No complications documented.

## 2019-08-12 NOTE — Discharge Instructions (Signed)

## 2019-08-12 NOTE — Progress Notes (Signed)
Patient ambulated in the hall and to the bathroom. Ambulated and voided without difficulty. No new bleeding noted. Right groin remains soft. Per Dr. Johney Frame, okay for pt to DC home.

## 2019-08-13 ENCOUNTER — Encounter (HOSPITAL_COMMUNITY): Payer: Self-pay | Admitting: Internal Medicine

## 2019-08-13 MED FILL — Heparin Sod (Porcine)-NaCl IV Soln 1000 Unit/500ML-0.9%: INTRAVENOUS | Qty: 500 | Status: AC

## 2019-08-13 NOTE — Anesthesia Postprocedure Evaluation (Signed)
Anesthesia Post Note  Patient: Ross Robinson.  Procedure(s) Performed: ATRIAL FIBRILLATION ABLATION (N/A )     Patient location during evaluation: PACU Anesthesia Type: General Level of consciousness: awake and alert Pain management: pain level controlled Vital Signs Assessment: post-procedure vital signs reviewed and stable Respiratory status: spontaneous breathing, nonlabored ventilation and respiratory function stable Cardiovascular status: blood pressure returned to baseline and stable Postop Assessment: no apparent nausea or vomiting Anesthetic complications: no   No complications documented.  Last Vitals:  Vitals:   08/12/19 1300 08/12/19 1355  BP: 121/85 (!) 133/91  Pulse: 64 73  Resp: 14 19  Temp:    SpO2: 97% 99%    Last Pain:  Vitals:   08/12/19 1147  TempSrc:   PainSc: 0-No pain                 Beryle Lathe

## 2019-08-15 ENCOUNTER — Telehealth: Payer: Self-pay | Admitting: Internal Medicine

## 2019-08-15 MED ORDER — METOPROLOL TARTRATE 25 MG PO TABS: 25.0000 mg | ORAL_TABLET | Freq: Three times a day (TID) | ORAL | 3 refills | Status: DC

## 2019-08-15 NOTE — Telephone Encounter (Signed)
Returned call to Pt.  Advised per DC with afib clinic- Pt should reduce metoprolol to TID over the weekend and keep a log of heart rates.  Advised to call afib clinic on Monday to report.  Pt indicates understanding/agreement.

## 2019-08-15 NOTE — Telephone Encounter (Signed)
° ° °  STAT if HR is under 50 or over 120 (normal HR is 60-100 beats per minute)  1) What is your heart rate? 48  2) Do you have a log of your heart rate readings (document readings)? Since yesterday its been 47-52   3) Do you have any other symptoms? Pt said his HR been low, over all he feel great but would like for Dr. Johney Frame know if he needs to change his meds due to low HR

## 2019-08-18 ENCOUNTER — Ambulatory Visit: Payer: Medicare Other | Admitting: Cardiology

## 2019-08-18 ENCOUNTER — Telehealth: Payer: Self-pay

## 2019-08-18 MED ORDER — AMOXICILLIN-POT CLAVULANATE 875-125 MG PO TABS
1.0000 | ORAL_TABLET | Freq: Two times a day (BID) | ORAL | 0 refills | Status: AC
Start: 2019-08-18 — End: 2019-08-28

## 2019-08-18 NOTE — Telephone Encounter (Signed)
Patient has afib ablation last week. He is now coughing up yellow sputum, having some body aches and chills along with temp of 100.8 this morning. He said that yesterday he jsut did not feel good. He has already spoken with cardiovascular and was advised by them to reach out to PCP or someone regarding a secondary infection. He denies nausea, vomiting, diarrhea, headaches, purulent drainage from incision. Please advise on further instructions and thank you.

## 2019-08-18 NOTE — Telephone Encounter (Signed)
Pt  Called with report of HRs over weekend - -HR running 58-62 now - feels fine - pt does report having fever 100.8 this AM coughing up colored sputum. Instructed pt to see PCP today to ensure no infection starting. Pt in agreement. Will call if issues.

## 2019-08-18 NOTE — Addendum Note (Signed)
Addended by: Mliss Fritz I on: 08/18/2019 11:19 AM   Modules accepted: Orders

## 2019-08-18 NOTE — Telephone Encounter (Signed)
Please have Ross Robinson start Augmentin 875 1 tablet twice a day for the next 10 days.

## 2019-08-18 NOTE — Telephone Encounter (Signed)
Prescription has been sent in. I also spoke with patient and informed him of medication instructions.

## 2019-08-22 ENCOUNTER — Other Ambulatory Visit: Payer: Self-pay | Admitting: Cardiology

## 2019-08-27 MED ORDER — METOPROLOL TARTRATE 25 MG PO TABS: 25.0000 mg | ORAL_TABLET | Freq: Two times a day (BID) | ORAL | 3 refills | Status: DC

## 2019-08-27 NOTE — Telephone Encounter (Signed)
Pt calling stating HRs staying 49-51 despite decrease in metoprolol to TID. Instructed to decrease metoprolol to 25mg  BID and call Friday with update. Pt in agreement.

## 2019-08-27 NOTE — Addendum Note (Signed)
Addended by: Shona Simpson on: 08/27/2019 12:18 PM   Modules accepted: Orders

## 2019-08-29 NOTE — Telephone Encounter (Signed)
Pt has been taking cardizem 60mg  4 times/day along with his metoprolol HRs still in the 40s-50s feeling lethargic.  Per NP will slowly wean cardizem. Will go to TID over weekend then BID Monday - call on Tuesday with update. Pt in agreement.

## 2019-09-02 ENCOUNTER — Telehealth (HOSPITAL_COMMUNITY): Payer: Self-pay

## 2019-09-02 NOTE — Telephone Encounter (Signed)
Patient called and states the last two days he had weaned down on his Cardizem medication to twice daily. His heart rate the last two days has been 49. Per [name] he needs to stop the Cardizem medication and he should continue taking the Metoprolol medication 25mg  twice daily. He is scheduled for his follow up with on 09/09/19 and he was given the parking code for July. Consulted with patient and he understood.

## 2019-09-09 ENCOUNTER — Ambulatory Visit (HOSPITAL_COMMUNITY)
Admission: RE | Admit: 2019-09-09 | Discharge: 2019-09-09 | Disposition: A | Discharge status: Home / Self Care | Payer: Medicare Other | Source: Ambulatory Visit | Attending: Nurse Practitioner | Admitting: Nurse Practitioner

## 2019-09-09 ENCOUNTER — Other Ambulatory Visit: Payer: Self-pay | Admitting: Allergy and Immunology

## 2019-09-09 ENCOUNTER — Encounter (HOSPITAL_COMMUNITY): Payer: Self-pay | Admitting: Nurse Practitioner

## 2019-09-09 ENCOUNTER — Other Ambulatory Visit: Payer: Self-pay

## 2019-09-09 VITALS — BP 142/80 | HR 54 | Ht 70.0 in | Wt 176.2 lb

## 2019-09-09 DIAGNOSIS — Z7901 Long term (current) use of anticoagulants: Secondary | ICD-10-CM | POA: Insufficient documentation

## 2019-09-09 DIAGNOSIS — Z881 Allergy status to other antibiotic agents status: Secondary | ICD-10-CM | POA: Diagnosis not present

## 2019-09-09 DIAGNOSIS — Z885 Allergy status to narcotic agent status: Secondary | ICD-10-CM | POA: Insufficient documentation

## 2019-09-09 DIAGNOSIS — Z7951 Long term (current) use of inhaled steroids: Secondary | ICD-10-CM | POA: Diagnosis not present

## 2019-09-09 DIAGNOSIS — J4540 Moderate persistent asthma, uncomplicated: Secondary | ICD-10-CM | POA: Insufficient documentation

## 2019-09-09 DIAGNOSIS — D6869 Other thrombophilia: Secondary | ICD-10-CM

## 2019-09-09 DIAGNOSIS — Z79899 Other long term (current) drug therapy: Secondary | ICD-10-CM | POA: Diagnosis not present

## 2019-09-09 DIAGNOSIS — I4819 Other persistent atrial fibrillation: Secondary | ICD-10-CM

## 2019-09-09 NOTE — Progress Notes (Signed)
Primary Care Physician: Pc, [facility] Referring Physician:Dr. Allred    Ross Robinson. is a 69 y.o. male with a h/o persistent afib that is in the afib clinic for f/u ablation.. No swallowing or groin issues. He  had noted bradycardia since the procedure. He called the office and he was weaned off Cardizem. He was seeing HR's in the 40's. He is now just on metoprolol 25 mg bid and now is  in the mid 50's. Overall  he feels improved. Not quite back to his normal activities, trying to gradually increase his  tolerance  to get back to his usual outdoor activities.  Being compliant with eliquis 5 mg bid for CHA2DS2VASc score of 2.   Today, he denies symptoms of palpitations, chest pain, shortness of breath, orthopnea, PND, lower extremity edema, dizziness, presyncope, syncope, or neurologic sequela. The patient is tolerating medications without difficulties and is otherwise without complaint today.   Past Medical History:  Diagnosis Date  . Allergic rhinitis   . Asthma   . LPRD (laryngopharyngeal reflux disease)   . Moderate persistent asthma without complication 04/21/2019  . Other allergic rhinitis 04/21/2019  . Persistent atrial fibrillation [facility])    Past Surgical History:  Procedure Laterality Date  . ADENOIDECTOMY    . ATRIAL FIBRILLATION ABLATION N/A 08/12/2019   Procedure: ATRIAL FIBRILLATION ABLATION;  Surgeon: Hillis Range, MD;  Location: [facility] INVASIVE CV LAB;  Service: Cardiovascular;  Laterality: N/A;  . COLON RESECTION    . HERNIA REPAIR    . SINOSCOPY    . TONSILLECTOMY      Current Outpatient Medications  Medication Sig Dispense Refill  . acetaminophen (TYLENOL) 500 MG tablet Take 1,000 mg by mouth every 6 (six) hours as needed for moderate pain.    Marland Kitchen albuterol (VENTOLIN HFA) 108 (90 Base) MCG/ACT inhaler Inhale two puffs every four to six hours as needed for cough or wheeze. (Patient taking differently: Inhale 2 puffs into the lungs every 6 (six) hours as  needed for wheezing or shortness of breath. Inhale two puffs every four to six hours as needed for cough or wheeze.) 18 g 0  . azelastine (ASTELIN) 0.1 % nasal spray Place 1 spray into both nostrils daily. Use in each nostril as directed    . budesonide-formoterol (SYMBICORT) 160-4.5 MCG/ACT inhaler Inhale 2 puffs twice daily to prevent coughing or wheezing.  Rinse, gargle, and spit after use.    . diazepam (VALIUM) 10 MG tablet Take 5 mg by mouth every 6 (six) hours as needed for anxiety.    Marland Kitchen ELIQUIS 5 MG TABS tablet Take 1 tablet (5 mg total) by mouth 2 (two) times daily. 60 tablet 3  . hydrocortisone (ANUSOL-HC) 2.5 % rectal cream Place 1 application rectally daily.    . metoprolol tartrate (LOPRESSOR) 25 MG tablet Take 1 tablet (25 mg total) by mouth 2 (two) times daily. 120 tablet 3  . mometasone (NASONEX) 50 MCG/ACT nasal spray Place 1 spray into the nose daily. Alternate daily with Astelin nasal spray    . omeprazole (PRILOSEC) 20 MG capsule Take 1 capsule (20 mg total) by mouth daily. 90 capsule 0  . zafirlukast (ACCOLATE) 20 MG tablet TAKE 1 TABLET BY MOUTH TWICE A DAY 60 tablet 2   No current facility-administered medications for this encounter.    Allergies  Allergen Reactions  . Codeine Nausea And Vomiting  . Biaxin [Clarithromycin] Rash  . Ceftin [Cefuroxime Axetil] Rash    Social History  Socioeconomic History  . Marital status: Married    Spouse name: Not on file  . Number of children: Not on file  . Years of education: Not on file  . Highest education level: Not on file  Occupational History  . Not on file  Tobacco Use  . Smoking status: Never Smoker  . Smokeless tobacco: Never Used  Vaping Use  . Vaping Use: Never used  Substance and Sexual Activity  . Alcohol use: Yes    Alcohol/week: 0.0 standard drinks    Comment: none recently  . Drug use: No  . Sexual activity: Not Currently  Other Topics Concern  . Not on file  Social History Narrative   Lives in  [place] with wife.   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 Difficulty of Paying Living Expenses:   Food Insecurity:   . Worried About Programme researcher, broadcasting/film/video in the Last Year:   . Barista in the Last Year:   Transportation Needs:   . Freight forwarder (Medical):   Marland Kitchen Lack of Transportation (Non-Medical):   Physical Activity:   . Days of Exercise per Week:   . Minutes of Exercise per Session:   Stress:   . Feeling of Stress :   Social Connections:   . Frequency of Communication with Friends and Family:   . Frequency of Social Gatherings with Friends and Family:   . Attends Religious Services:   . Active Member of Clubs or Organizations:   . Attends Banker Meetings:   Marland Kitchen Marital Status:   Intimate Partner Violence:   . Fear of Current or Ex-Partner:   . Emotionally Abused:   Marland Kitchen Physically Abused:   . Sexually Abused:     Family History  Adopted: Yes  Problem Relation Age of Onset  . Allergic rhinitis Neg Hx   . Angioedema Neg Hx   . Asthma Neg Hx   . Atopy Neg Hx   . Eczema Neg Hx   . Immunodeficiency Neg Hx   . Urticaria Neg Hx     ROS- All systems are reviewed and negative except as per the HPI above  Physical Exam: Vitals:   09/09/19 1336  BP: (!) 142/80  Pulse: (!) 54  SpO2: 97%  Weight: 79.9 kg  Height: 5\' 10"  (1.778 m)   Wt Readings from Last 3 Encounters:  09/09/19 79.9 kg  08/12/19 83 kg  06/25/19 80.3 kg    Labs: Lab Results  Component Value Date   NA 145 (H) 07/29/2019   K 4.1 07/29/2019   CL 107 (H) 07/29/2019   CO2 23 07/29/2019   GLUCOSE 83 07/29/2019   BUN 24 07/29/2019   CREATININE 1.02 07/29/2019   CALCIUM 8.9 07/29/2019   No results found for: INR No results found for: CHOL, HDL, LDLCALC, TRIG   GEN- The patient is well appearing, alert and oriented x 3 today.   Head-  normocephalic, atraumatic Eyes-  Sclera clear, conjunctiva pink Ears- hearing intact Oropharynx- clear Neck- supple, no JVP Lymph- no cervical lymphadenopathy Lungs- Clear to ausculation bilaterally, normal work of breathing Heart- Regular rate and rhythm, no murmurs, rubs or gallops, PMI not laterally displaced GI- soft, NT, ND, + BS Extremities- no clubbing, cyanosis, or edema MS- no significant deformity or atrophy Skin- no rash or lesion Psych-  euthymic mood, full affect Neuro- strength and sensation are intact  EKG-Sinus brady at 54 bpm, pr int 126 ms, qrs int 112 ms, qtc 430 ms     Assessment and Plan: 1. afib  No afib since afib ablation that pt has appreciated  Tracks with [employer] He is now off Cardizem with reported HR's in the 40's Continue  metoprolol 25 mg bid for now with watching HR's for profound bradycardia   2. CHA2DS2VASc score of 2 Continue eliquis 5 mg bid without interruption  F/u with Dr. Johney Frame 9/22  Ross Robinson. [facility] [address] [place], [place] [address] [phone]

## 2019-09-26 ENCOUNTER — Other Ambulatory Visit: Payer: Self-pay | Admitting: Cardiology

## 2019-10-14 ENCOUNTER — Other Ambulatory Visit: Payer: Self-pay | Admitting: Cardiology

## 2019-10-22 ENCOUNTER — Ambulatory Visit (INDEPENDENT_AMBULATORY_CARE_PROVIDER_SITE_OTHER): Payer: Medicare Other | Admitting: Allergy and Immunology

## 2019-10-22 ENCOUNTER — Other Ambulatory Visit: Payer: Self-pay

## 2019-10-22 ENCOUNTER — Encounter: Payer: Self-pay | Admitting: Allergy and Immunology

## 2019-10-22 VITALS — BP 112/70 | HR 56 | Resp 14

## 2019-10-22 DIAGNOSIS — K219 Gastro-esophageal reflux disease without esophagitis: Secondary | ICD-10-CM | POA: Diagnosis not present

## 2019-10-22 DIAGNOSIS — J4540 Moderate persistent asthma, uncomplicated: Secondary | ICD-10-CM

## 2019-10-22 DIAGNOSIS — J3089 Other allergic rhinitis: Secondary | ICD-10-CM | POA: Diagnosis not present

## 2019-10-22 MED ORDER — AZELASTINE HCL 0.1 % NA SOLN: NASAL | 1 refills | Status: DC

## 2019-10-22 MED ORDER — BUDESONIDE-FORMOTEROL FUMARATE 160-4.5 MCG/ACT IN AERO: INHALATION_SPRAY | RESPIRATORY_TRACT | 1 refills | Status: DC

## 2019-10-22 MED ORDER — MOMETASONE FUROATE 50 MCG/ACT NA SUSP: NASAL | 1 refills | Status: DC

## 2019-10-22 MED ORDER — ZAFIRLUKAST 20 MG PO TABS
20.0000 mg | ORAL_TABLET | Freq: Two times a day (BID) | ORAL | 1 refills | Status: DC
Start: 2019-10-22 — End: 2020-05-24

## 2019-10-22 MED ORDER — OMEPRAZOLE 20 MG PO CPDR
20.0000 mg | DELAYED_RELEASE_CAPSULE | Freq: Every day | ORAL | 1 refills | Status: DC
Start: 2019-10-22 — End: 2020-06-02

## 2019-10-22 NOTE — Progress Notes (Signed)
[place] [facility] [place] - Ross Robinson - [place] [facility] [place]   Follow-up Note  Referring Provider: Pc, [facility] Primary Provider: Hal Morales, NP Date of Office Visit: 10/22/2019  Subjective:   Ross Robinson. (DOB: Aug 25, 1950) is a 69 y.o. male who [facility] on 10/22/2019 in re-evaluation of the following:  HPI: Ross Robinson returns to this clinic in reevaluation of asthma and allergic rhinoconjunctivitis and a history of chronic sinusitis and history of LPR.  I last saw him in this clinic on 01 May 2019 at which point time he presented to the clinic with atrial fibrillation with rapid ventricular rate.  Fortunately, he has had his atrial fibrillation addressed and after undergoing a cardio ablation he has not had any atrial fibrillation activity while he remains on metoprolol and Eliquis.  He has really done very well regarding his airway issue.  He has very little coughing or wheezing or shortness of breath and rarely uses a short acting bronchodilator while he continues on Symbicort and Accolate on a regular basis.  He has had very little issues with his nose while using a nasal steroid.  His reflux is under excellent control with the use of omeprazole.  He has received 2 Pfizer Covid vaccines.  Allergies as of 10/22/2019      Reactions   Codeine Nausea And Vomiting   Biaxin [clarithromycin] Rash   Ceftin [cefuroxime Axetil] Rash      Medication List      acetaminophen 500 MG tablet Commonly known as: TYLENOL Take 1,000 mg by mouth every 6 (six) hours as needed for moderate pain.   albuterol 108 (90 Base) MCG/ACT inhaler Commonly known as: Ventolin HFA Inhale two puffs every four to six hours as needed for cough or wheeze.   azelastine 0.1 % nasal spray Commonly known as: ASTELIN Can use two sprays in each nostril twice daily if needed.   budesonide-formoterol 160-4.5 MCG/ACT inhaler Commonly known as: SYMBICORT Inhale 2  puffs twice daily to prevent coughing or wheezing.  Rinse, gargle, and spit after use.   diazepam 10 MG tablet Commonly known as: VALIUM Take 5 mg by mouth every 6 (six) hours as needed for anxiety.   Eliquis 5 MG Tabs tablet Generic drug: apixaban TAKE 1 TABLET BY MOUTH TWICE A DAY   hydrocortisone 2.5 % rectal cream Commonly known as: ANUSOL-HC Place 1 application rectally daily.   metoprolol tartrate 25 MG tablet Commonly known as: LOPRESSOR Take 1 tablet (25 mg total) by mouth 2 (two) times daily.   mometasone 50 MCG/ACT nasal spray Commonly known as: NASONEX Use one spray in each nostril one to two times daily as directed.   omeprazole 20 MG capsule Commonly known as: PRILOSEC Take 1 capsule (20 mg total) by mouth daily.   zafirlukast 20 MG tablet Commonly known as: ACCOLATE Take 1 tablet (20 mg total) by mouth 2 (two) times daily.       Past Medical History:  Diagnosis Date  . Allergic rhinitis   . Asthma   . LPRD (laryngopharyngeal reflux disease)   . Moderate persistent asthma without complication 04/21/2019  . Other allergic rhinitis 04/21/2019  . Persistent atrial fibrillation [facility])     Past Surgical History:  Procedure Laterality Date  . ADENOIDECTOMY    . ATRIAL FIBRILLATION ABLATION N/A 08/12/2019   Procedure: ATRIAL FIBRILLATION ABLATION;  Surgeon: Hillis Range, MD;  Location: [facility];  Service: Cardiovascular;  Laterality: N/A;  .  COLON RESECTION    . HERNIA REPAIR    . SINOSCOPY    . TONSILLECTOMY      Review of systems negative except as noted in HPI / PMHx or noted below:  Review of Systems  Constitutional: Negative.   HENT: Negative.   Eyes: Negative.   Respiratory: Negative.   Cardiovascular: Negative.   Gastrointestinal: Negative.   Genitourinary: Negative.   Musculoskeletal: Negative.   Skin: Negative.   Neurological: Negative.   Endo/Heme/Allergies: Negative.   Psychiatric/Behavioral: Negative.      Objective:    Vitals:   10/22/19 1512  BP: 112/70  Pulse: (!) 56  Resp: 14  SpO2: 96%          Physical Exam Constitutional:      Appearance: He is not diaphoretic.  HENT:     Head: Normocephalic.     Right Ear: Tympanic membrane, ear canal and external ear normal.     Left Ear: Tympanic membrane, ear canal and external ear normal.     Nose: Nose normal. No mucosal edema or rhinorrhea.     Mouth/Throat:     Pharynx: Uvula midline. No oropharyngeal exudate.  Eyes:     Conjunctiva/sclera: Conjunctivae normal.  Neck:     Thyroid: No thyromegaly.     Trachea: Trachea normal. No tracheal tenderness or tracheal deviation.  Cardiovascular:     Rate and Rhythm: Normal rate and regular rhythm.     Heart sounds: Normal heart sounds, S1 normal and S2 normal. No murmur heard.   Pulmonary:     Effort: No respiratory distress.     Breath sounds: Normal breath sounds. No stridor. No wheezing or rales.  Lymphadenopathy:     Head:     Right side of head: No tonsillar adenopathy.     Left side of head: No tonsillar adenopathy.     Cervical: No cervical adenopathy.  Skin:    Findings: No erythema or rash.     Nails: There is no clubbing.  Neurological:     Mental Status: He is alert.     Diagnostics:    Spirometry was performed and demonstrated an FEV1 of 2.50 at 74 % of predicted.  Assessment and Plan:   1. Asthma, moderate persistent, well-controlled   2. Other allergic rhinitis   3. LPRD (laryngopharyngeal reflux disease)     1. Continue Symbicort 160 2 inhalations twice a day   2. Continue Accolate 20 mg twice a day  3. Continue Nasonex one spray each nostril 1-2 times a day  4. Continue omeprazole 20 mg daily  5. If needed:    C. Astelin   B. Ventolin HFA  C. Mucinex DM  6. Obtain fall flu vaccine and Covid booster  7. Return to clinic in 6 months or earlier if problem  Ross Robinson appears to be doing quite well and he will remain on anti-inflammatory agents for his  airway and therapy directed against reflux as noted above and I will see him back in his clinic in 6 months or earlier if there is a problem.  Laurette Schimke, MD Allergy / Immunology [facility]

## 2019-10-22 NOTE — Patient Instructions (Addendum)
  1. Continue Symbicort 160 2 inhalations twice a day   2. Continue Accolate 20 mg twice a day  3. Continue Nasonex one spray each nostril 1-2 times a day  4. Continue omeprazole 20 mg daily  5. If needed:    C. Astelin   B. Ventolin HFA  C. Mucinex DM  6. Obtain fall flu vaccine and Covid booster  7. Return to clinic in 6 months or earlier if problem

## 2019-10-23 ENCOUNTER — Encounter: Payer: Self-pay | Admitting: Allergy and Immunology

## 2019-10-24 ENCOUNTER — Telehealth: Payer: Self-pay | Admitting: Internal Medicine

## 2019-10-24 NOTE — Telephone Encounter (Signed)
Patient states he was told there were certain allergy medications he wasn't allowed to take after his ablation and he does not remember what it was he could take. Please advise.

## 2019-10-24 NOTE — Telephone Encounter (Signed)
Returned call to patient avoid allergy medication with decongestants recommend claritin or zyrtec if needed for allergies. Pt appreciative of call.

## 2019-11-09 ENCOUNTER — Other Ambulatory Visit: Payer: Self-pay | Admitting: Allergy and Immunology

## 2019-11-12 ENCOUNTER — Ambulatory Visit (INDEPENDENT_AMBULATORY_CARE_PROVIDER_SITE_OTHER): Payer: Medicare Other | Admitting: Internal Medicine

## 2019-11-12 ENCOUNTER — Other Ambulatory Visit: Payer: Self-pay

## 2019-11-12 ENCOUNTER — Encounter: Payer: Self-pay | Admitting: Internal Medicine

## 2019-11-12 VITALS — BP 118/70 | HR 65 | Ht 70.0 in | Wt 177.6 lb

## 2019-11-12 DIAGNOSIS — D6869 Other thrombophilia: Secondary | ICD-10-CM

## 2019-11-12 DIAGNOSIS — I4819 Other persistent atrial fibrillation: Secondary | ICD-10-CM

## 2019-11-12 DIAGNOSIS — I5022 Chronic systolic (congestive) heart failure: Secondary | ICD-10-CM | POA: Diagnosis not present

## 2019-11-12 NOTE — Progress Notes (Signed)
PCP: Hal Morales, NP Primary Cardiologist: Dr Cinda Quest. is a 69 y.o. male who presents today for routine electrophysiology followup.  Since his recent afib ablation, the patient reports doing very well.  he denies procedure related complications and is pleased with the results of the procedure.  Today, he denies symptoms of palpitations, chest pain, shortness of breath,  lower extremity edema, dizziness, presyncope, or syncope.  The patient is otherwise without complaint today.   Past Medical History:  Diagnosis Date  . Allergic rhinitis   . Asthma   . LPRD (laryngopharyngeal reflux disease)   . Moderate persistent asthma without complication 04/21/2019  . Other allergic rhinitis 04/21/2019  . Persistent atrial fibrillation [facility])    Past Surgical History:  Procedure Laterality Date  . ADENOIDECTOMY    . ATRIAL FIBRILLATION ABLATION N/A 08/12/2019   Procedure: ATRIAL FIBRILLATION ABLATION;  Surgeon: Hillis Range, MD;  [facility];  Service: Cardiovascular;  Laterality: N/A;  . COLON RESECTION    . HERNIA REPAIR    . SINOSCOPY    . TONSILLECTOMY      ROS- all systems are personally reviewed and negatives except as per HPI above  Current Outpatient Medications  Medication Sig Dispense Refill  . acetaminophen (TYLENOL) 500 MG tablet Take 1,000 mg by mouth every 6 (six) hours as needed for moderate pain.    Marland Kitchen albuterol (VENTOLIN HFA) 108 (90 Base) MCG/ACT inhaler Inhale two puffs every four to six hours as needed for cough or wheeze. 18 g 0  . azelastine (ASTELIN) 0.1 % nasal spray Can use two sprays in each nostril twice daily if needed. 90 mL 1  . diazepam (VALIUM) 10 MG tablet Take 5 mg by mouth every 6 (six) hours as needed for anxiety.    Marland Kitchen ELIQUIS 5 MG TABS tablet TAKE 1 TABLET BY MOUTH TWICE A DAY 180 tablet 1  . hydrocortisone (ANUSOL-HC) 2.5 % rectal cream Place 1 application rectally daily.    . metoprolol tartrate (LOPRESSOR) 25 MG tablet  Take 1 tablet (25 mg total) by mouth 2 (two) times daily. 120 tablet 3  . mometasone (NASONEX) 50 MCG/ACT nasal spray Use one spray in each nostril one to two times daily as directed. 3 each 1  . omeprazole (PRILOSEC) 20 MG capsule Take 1 capsule (20 mg total) by mouth daily. 90 capsule 1  . SYMBICORT 160-4.5 MCG/ACT inhaler INHALE 2 PUFFS TWICE DAILY TO PREVENT COUGHING OR WHEEZING. RINSE, GARGLE, AND SPIT AFTER USE. 30.6 each 1  . zafirlukast (ACCOLATE) 20 MG tablet Take 1 tablet (20 mg total) by mouth 2 (two) times daily. 180 tablet 1   No current facility-administered medications for this visit.    Physical Exam: Vitals:   11/12/19 1407  BP: 118/70  Pulse: 65  SpO2: 97%  Weight: 177 lb 9.6 oz (80.6 kg)  Height: 5\' 10"  (1.778 m)    GEN- The patient is well appearing, alert and oriented x 3 today.   Head- normocephalic, atraumatic Eyes-  Sclera clear, conjunctiva pink Ears- hearing intact Oropharynx- clear Lungs- Clear to ausculation bilaterally, normal work of breathing Heart- Regular rate and rhythm, no murmurs, rubs or gallops, PMI not laterally displaced GI- soft, NT, ND, + BS Extremities- no clubbing, cyanosis, or edema  EKG tracing ordered today is personally reviewed and shows sinus with PACs, short PR  Assessment and Plan:  1. Persistent atrial fibrillation Doing well s/p ablation chads2vasc score is 2.  He is on eliquis  2. 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 Short PR HV interval at EPS was 38 msec,  No obvious pre-excitation No further workup at this time  Return to see me in 3 months  Hillis Range MD, [facility] 11/12/2019 2:24 PM

## 2019-11-12 NOTE — Patient Instructions (Addendum)
Medication Instructions:  Your physician recommends that you continue on your current medications as directed. Please refer to the Current Medication list given to you today.  *If you need a refill on your cardiac medications before your next appointment, please call your pharmacy*  Lab Work: None ordered.  If you have labs (blood work) drawn today and your tests are completely normal, you will receive your results only by: Marland Kitchen MyChart Message (if you have MyChart) OR . A paper copy in the mail If you have any lab test that is abnormal or we need to change your treatment, we will call you to review the results.  Testing/Procedures: Please schedule ECHO before follow up 01/30/20 Your physician has requested that you have an echocardiogram. Echocardiography is a painless test that uses sound waves to create images of your heart. It provides your doctor with information about the size and shape of your heart and how well your heart's chambers and valves are working. This procedure takes approximately one hour. There are no restrictions for this procedure.   Follow-Up: At [facility], you and your health needs are our priority.  As part of our continuing mission to provide you with exceptional heart care, we have created designated Provider Care Teams.  These Care Teams include your primary Cardiologist (physician) and Advanced Practice Providers (APPs -  Physician Assistants and Nurse Practitioners) who all work together to provide you with the care you need, when you need it.  We recommend signing up for the patient portal called "MyChart".  Sign up information is provided on this After Visit Summary.  MyChart is used to connect with patients for Virtual Visits (Telemedicine).  Patients are able to view lab/test results, encounter notes, upcoming appointments, etc.  Non-urgent messages can be sent to your provider as well.   To learn more about what you can do with MyChart, go to  [email or web address].    Your next appointment:   Your physician wants you to follow-up in: 01/30/20 at 10:15 am with Dr. Johney Frame.    Other Instructions:

## 2019-11-20 ENCOUNTER — Telehealth: Payer: Self-pay | Admitting: Allergy and Immunology

## 2019-11-20 NOTE — Telephone Encounter (Signed)
Please inform [name] that he should obtain the flu vaccine before Halloween.  He should wait 2 weeks in between Covid booster and flu vaccine.

## 2019-11-20 NOTE — Telephone Encounter (Signed)
Patient informed. 

## 2019-11-20 NOTE — Telephone Encounter (Signed)
Ross Robinson called in and stated he had questions.  1.  Is it ok to receive the flu vaccine and Covid booster at the same time or should he wait a couple of days in between.  He states he heard several different things on that subject.  2.  Ross Robinson is visiting his son and grandchildren in the [place] for Christmas.  He would like to know when is the best time to get the flu shot so it was be effective during his trip.  He states he heard the shot is only good for 3 months.    Please advise.

## 2020-01-06 ENCOUNTER — Telehealth: Payer: Self-pay | Admitting: Allergy and Immunology

## 2020-01-06 ENCOUNTER — Other Ambulatory Visit: Payer: Self-pay | Admitting: Allergy and Immunology

## 2020-01-06 NOTE — Telephone Encounter (Signed)
ERX sent earlier by Rosana Fret, CMA.

## 2020-01-06 NOTE — Telephone Encounter (Signed)
[facility].

## 2020-01-23 ENCOUNTER — Ambulatory Visit (HOSPITAL_COMMUNITY): Payer: Medicare Other | Attending: Cardiovascular Disease

## 2020-01-23 ENCOUNTER — Other Ambulatory Visit: Payer: Self-pay

## 2020-01-23 DIAGNOSIS — I5022 Chronic systolic (congestive) heart failure: Secondary | ICD-10-CM

## 2020-01-23 DIAGNOSIS — D6869 Other thrombophilia: Secondary | ICD-10-CM | POA: Insufficient documentation

## 2020-01-23 DIAGNOSIS — I4819 Other persistent atrial fibrillation: Secondary | ICD-10-CM

## 2020-01-23 LAB — ECHOCARDIOGRAM COMPLETE
Area-P 1/2: 3.28 cm2
S' Lateral: 2.8 cm

## 2020-01-28 ENCOUNTER — Other Ambulatory Visit: Payer: Self-pay | Admitting: Cardiology

## 2020-01-30 ENCOUNTER — Other Ambulatory Visit: Payer: Self-pay

## 2020-01-30 ENCOUNTER — Encounter: Payer: Self-pay | Admitting: Internal Medicine

## 2020-01-30 ENCOUNTER — Ambulatory Visit (INDEPENDENT_AMBULATORY_CARE_PROVIDER_SITE_OTHER): Payer: Medicare Other | Admitting: Internal Medicine

## 2020-01-30 VITALS — BP 110/78 | HR 77 | Ht 70.0 in | Wt 178.0 lb

## 2020-01-30 DIAGNOSIS — I5022 Chronic systolic (congestive) heart failure: Secondary | ICD-10-CM | POA: Diagnosis not present

## 2020-01-30 DIAGNOSIS — I4819 Other persistent atrial fibrillation: Secondary | ICD-10-CM | POA: Diagnosis not present

## 2020-01-30 NOTE — Progress Notes (Signed)
PCP: Hal Morales, NP Primary Cardiologist: Dr Dulce Sellar Primary EP: Dr Daiva Eves. is a 69 y.o. male who presents today for routine electrophysiology followup.  Since last being seen in our clinic, the patient reports doing very well.  Today, he denies symptoms of palpitations, chest pain, shortness of breath,  lower extremity edema, dizziness, presyncope, or syncope.  The patient is otherwise without complaint today.   Past Medical History:  Diagnosis Date  . Allergic rhinitis   . Asthma   . LPRD (laryngopharyngeal reflux disease)   . Moderate persistent asthma without complication 04/21/2019  . Other allergic rhinitis 04/21/2019  . Persistent atrial fibrillation [facility])    Past Surgical History:  Procedure Laterality Date  . ADENOIDECTOMY    . ATRIAL FIBRILLATION ABLATION N/A 08/12/2019   Procedure: ATRIAL FIBRILLATION ABLATION;  Surgeon: Hillis Range, MD;  [facility];  Service: Cardiovascular;  Laterality: N/A;  . COLON RESECTION    . HERNIA REPAIR    . SINOSCOPY    . TONSILLECTOMY      ROS- all systems are reviewed and negatives except as per HPI above  Current Outpatient Medications  Medication Sig Dispense Refill  . acetaminophen (TYLENOL) 500 MG tablet Take 1,000 mg by mouth every 6 (six) hours as needed for moderate pain.    Marland Kitchen albuterol (VENTOLIN HFA) 108 (90 Base) MCG/ACT inhaler INHALE TWO PUFFS EVERY FOUR TO SIX HOURS AS NEEDED FOR COUGH OR WHEEZE. 18 each 1  . azelastine (ASTELIN) 0.1 % nasal spray Can use two sprays in each nostril twice daily if needed. 90 mL 1  . diazepam (VALIUM) 10 MG tablet Take 5 mg by mouth every 6 (six) hours as needed for anxiety.    Marland Kitchen ELIQUIS 5 MG TABS tablet TAKE 1 TABLET BY MOUTH TWICE A DAY 180 tablet 1  . hydrocortisone (ANUSOL-HC) 2.5 % rectal cream Place 1 application rectally daily.    . metoprolol tartrate (LOPRESSOR) 25 MG tablet Take 1 tablet (25 mg total) by mouth 2 (two) times daily. 60 tablet 3  .  mometasone (NASONEX) 50 MCG/ACT nasal spray Use one spray in each nostril one to two times daily as directed. 3 each 1  . omeprazole (PRILOSEC) 20 MG capsule Take 1 capsule (20 mg total) by mouth daily. 90 capsule 1  . SYMBICORT 160-4.5 MCG/ACT inhaler INHALE 2 PUFFS TWICE DAILY TO PREVENT COUGHING OR WHEEZING. RINSE, GARGLE, AND SPIT AFTER USE. 30.6 each 1  . zafirlukast (ACCOLATE) 20 MG tablet Take 1 tablet (20 mg total) by mouth 2 (two) times daily. 180 tablet 1   No current facility-administered medications for this visit.    Physical Exam: Vitals:   01/30/20 1558  BP: 110/78  Pulse: 77  SpO2: 95%  Weight: 178 lb (80.7 kg)  Height: 5\' 10"  (1.778 m)    GEN- The patient is well appearing, alert and oriented x 3 today.   Head- normocephalic, atraumatic Eyes-  Sclera clear, conjunctiva pink Ears- hearing intact Oropharynx- clear Lungs- Clear to ausculation bilaterally, normal work of breathing Heart- Regular rate and rhythm, no murmurs, rubs or gallops, PMI not laterally displaced GI- soft, NT, ND, + BS Extremities- no clubbing, cyanosis, or edema  Wt Readings from Last 3 Encounters:  01/30/20 178 lb (80.7 kg)  11/12/19 177 lb 9.6 oz (80.6 kg)  09/09/19 176 lb 3.2 oz (79.9 kg)    EKG tracing ordered today is personally reviewed and shows sinus with short PR  Assessment and Plan:  1. Persistent afib Doing very well post ablation off AAD therapy chads2vasc score is 2 (though perhaps now 1 with normalization of EF).  He is on eliquis Consider weaning metoprolol if no afib on return  2. 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   Return in 6 months  Hillis Range MD, [facility] 01/30/2020 4:04 PM

## 2020-01-30 NOTE — Patient Instructions (Addendum)
Medication Instructions:  Your physician recommends that you continue on your current medications as directed. Please refer to the Current Medication list given to you today.  *If you need a refill on your cardiac medications before your next appointment, please call your pharmacy*  Lab Work: None ordered.  If you have labs (blood work) drawn today and your tests are completely normal, you will receive your results only by: Marland Kitchen MyChart Message (if you have MyChart) OR . A paper copy in the mail If you have any lab test that is abnormal or we need to change your treatment, we will call you to review the results.  Testing/Procedures: None ordered.  Follow-Up: At [facility], you and your health needs are our priority.  As part of our continuing mission to provide you with exceptional heart care, we have created designated Provider Care Teams.  These Care Teams include your primary Cardiologist (physician) and Advanced Practice Providers (APPs -  Physician Assistants and Nurse Practitioners) who all work together to provide you with the care you need, when you need it.  We recommend signing up for the patient portal called "MyChart".  Sign up information is provided on this After Visit Summary.  MyChart is used to connect with patients for Virtual Visits (Telemedicine).  Patients are able to view lab/test results, encounter notes, upcoming appointments, etc.  Non-urgent messages can be sent to your provider as well.   To learn more about what you can do with MyChart, go to [email or web address].    Your next appointment:   Your physician wants you to follow-up in: 07/28/20 at 11:45 am with Dr. Johney Frame.    Other Instructions:

## 2020-02-04 ENCOUNTER — Ambulatory Visit: Payer: Medicare Other | Admitting: Internal Medicine

## 2020-04-07 ENCOUNTER — Telehealth: Payer: Self-pay | Admitting: Cardiology

## 2020-04-07 MED ORDER — ELIQUIS 5 MG PO TABS
5.0000 mg | ORAL_TABLET | Freq: Two times a day (BID) | ORAL | 3 refills | Status: DC
Start: 2020-04-07 — End: 2020-07-29

## 2020-04-07 NOTE — Telephone Encounter (Signed)
Refill sent in per request.  

## 2020-04-07 NOTE — Telephone Encounter (Signed)
*  STAT* If patient is at the pharmacy, call can be transferred to refill team.   1. Which medications need to be refilled? (please list name of each medication and dose if known)  ELIQUIS 5 MG TABS tablet [[id number]]    2. Which pharmacy/location (including street and city if local pharmacy) is medication to be sent to? [id number], [place] - [address]  [address], [place] [address]  Phone:  [phone] Fax:  [phone]   3. Do they need a 30 day or 90 day supply? 90

## 2020-04-24 ENCOUNTER — Other Ambulatory Visit: Payer: Self-pay | Admitting: Allergy and Immunology

## 2020-05-24 ENCOUNTER — Other Ambulatory Visit: Payer: Self-pay | Admitting: Allergy and Immunology

## 2020-05-27 ENCOUNTER — Other Ambulatory Visit: Payer: Self-pay | Admitting: Allergy and Immunology

## 2020-05-28 ENCOUNTER — Other Ambulatory Visit: Payer: Self-pay | Admitting: Allergy and Immunology

## 2020-06-02 ENCOUNTER — Encounter: Payer: Self-pay | Admitting: Allergy and Immunology

## 2020-06-02 ENCOUNTER — Ambulatory Visit (INDEPENDENT_AMBULATORY_CARE_PROVIDER_SITE_OTHER): Payer: Medicare Other | Admitting: Allergy and Immunology

## 2020-06-02 ENCOUNTER — Other Ambulatory Visit: Payer: Self-pay

## 2020-06-02 VITALS — BP 118/74 | HR 87 | Resp 16 | Ht 70.0 in | Wt 186.8 lb

## 2020-06-02 DIAGNOSIS — J4540 Moderate persistent asthma, uncomplicated: Secondary | ICD-10-CM

## 2020-06-02 DIAGNOSIS — J3089 Other allergic rhinitis: Secondary | ICD-10-CM | POA: Diagnosis not present

## 2020-06-02 DIAGNOSIS — K219 Gastro-esophageal reflux disease without esophagitis: Secondary | ICD-10-CM | POA: Diagnosis not present

## 2020-06-02 MED ORDER — MOMETASONE FUROATE 50 MCG/ACT NA SUSP
NASAL | 2 refills | Status: DC
Start: 2020-06-02 — End: 2021-01-27

## 2020-06-02 MED ORDER — ALBUTEROL SULFATE HFA 108 (90 BASE) MCG/ACT IN AERS
INHALATION_SPRAY | RESPIRATORY_TRACT | 2 refills | Status: DC
Start: 2020-06-02 — End: 2020-09-03

## 2020-06-02 MED ORDER — ZAFIRLUKAST 20 MG PO TABS: 20.0000 mg | ORAL_TABLET | Freq: Two times a day (BID) | ORAL | 2 refills | Status: DC

## 2020-06-02 MED ORDER — OMEPRAZOLE 20 MG PO CPDR: 20.0000 mg | DELAYED_RELEASE_CAPSULE | Freq: Every day | ORAL | 2 refills | Status: DC

## 2020-06-02 MED ORDER — SYMBICORT 160-4.5 MCG/ACT IN AERO: INHALATION_SPRAY | RESPIRATORY_TRACT | 1 refills | Status: DC

## 2020-06-02 NOTE — Progress Notes (Signed)
[place] - [place] - [place] - [place]   Follow-up Note  Referring Provider: Hal Morales, NP Primary Provider: Hal Morales, NP Date of Office Visit: 06/02/2020  Subjective:   Ross Robinson. (DOB: 1950-03-10) is a 70 y.o. male who [facility] in re-evaluation of the following:  HPI: Ross Robinson returns to this clinic in evaluation of asthma and allergic rhinoconjunctivitis and history of LPR.  His last visit to this clinic was 22 October 2019.  He has had an excellent interval of time without the need for systemic steroid or antibiotic to treat an airway issue and rare use of a short acting bronchodilator and no limitation on ability to exercise.  He continues to use Symbicort on a consistent basis twice a day along with Accolate.    He has had very little issues with his nose while using Nasonex.    His reflux is under excellent control while using omeprazole.  He has received 3 Pfizer COVID vaccines.  Allergies as of 06/02/2020      Reactions   Codeine Nausea And Vomiting   Biaxin [clarithromycin] Rash   Ceftin [cefuroxime Axetil] Rash      Medication List      acetaminophen 500 MG tablet Commonly known as: TYLENOL Take 1,000 mg by mouth every 6 (six) hours as needed for moderate pain.   albuterol 108 (90 Base) MCG/ACT inhaler Commonly known as: VENTOLIN HFA 2 puffs every 4-6 hours as needed   azelastine 0.1 % nasal spray Commonly known as: ASTELIN Can use two sprays in each nostril twice daily if needed.   diazepam 10 MG tablet Commonly known as: VALIUM Take 5 mg by mouth every 6 (six) hours as needed for anxiety.   Eliquis 5 MG Tabs tablet Generic drug: apixaban Take 1 tablet (5 mg total) by mouth 2 (two) times daily.   hydrocortisone 2.5 % rectal cream Commonly known as: ANUSOL-HC Place 1 application rectally daily.   metoprolol tartrate 25 MG tablet Commonly known as: LOPRESSOR Take  1 tablet (25 mg total) by mouth 2 (two) times daily.   mometasone 50 MCG/ACT nasal spray Commonly known as: NASONEX 1 spray in each nostril 1-2 times daily   omeprazole 20 MG capsule Commonly known as: PRILOSEC Take 1 capsule (20 mg total) by mouth daily.   Symbicort 160-4.5 MCG/ACT inhaler Generic drug: budesonide-formoterol INHALE 2 PUFFS TWICE DAILY TO PREVENT COUGHING OR WHEEZING. RINSE, GARGLE, AND SPIT AFTER USE.   zafirlukast 20 MG tablet Commonly known as: ACCOLATE Take 1 tablet (20 mg total) by mouth 2 (two) times daily.       Past Medical History:  Diagnosis Date  . Allergic rhinitis   . Asthma   . LPRD (laryngopharyngeal reflux disease)   . Moderate persistent asthma without complication 04/21/2019  . Other allergic rhinitis 04/21/2019  . Persistent atrial fibrillation [facility])     Past Surgical History:  Procedure Laterality Date  . ADENOIDECTOMY    . ATRIAL FIBRILLATION ABLATION N/A 08/12/2019   Procedure: ATRIAL FIBRILLATION ABLATION;  Surgeon: Hillis Range, MD;  [facility];  Service: Cardiovascular;  Laterality: N/A;  . COLON RESECTION    . HERNIA REPAIR    . SINOSCOPY    . TONSILLECTOMY      Review of systems negative except as noted in HPI / PMHx or noted below:  Review of Systems  Constitutional: Negative.   HENT: Negative.  Eyes: Negative.   Respiratory: Negative.   Cardiovascular: Negative.   Gastrointestinal: Negative.   Genitourinary: Negative.   Musculoskeletal: Negative.   Skin: Negative.   Neurological: Negative.   Endo/Heme/Allergies: Negative.   Psychiatric/Behavioral: Negative.      Objective:   Vitals:   06/02/20 1547  BP: 118/74  Pulse: 87  Resp: 16  SpO2: 96%   Height: 5\' 10"  (177.8 cm)  Weight: 186 lb 12.8 oz (84.7 kg)   Physical Exam Constitutional:      Appearance: He is not diaphoretic.  HENT:     Head: Normocephalic.     Right Ear: Tympanic membrane, ear canal and external ear normal.      Left Ear: Tympanic membrane, ear canal and external ear normal.     Nose: Nose normal. No mucosal edema or rhinorrhea.     Mouth/Throat:     Pharynx: Uvula midline. No oropharyngeal exudate.  Eyes:     Conjunctiva/sclera: Conjunctivae normal.  Neck:     Thyroid: No thyromegaly.     Trachea: Trachea normal. No tracheal tenderness or tracheal deviation.  Cardiovascular:     Rate and Rhythm: Normal rate and regular rhythm.     Heart sounds: Normal heart sounds, S1 normal and S2 normal. No murmur heard.   Pulmonary:     Effort: No respiratory distress.     Breath sounds: Normal breath sounds. No stridor. No wheezing or rales.  Lymphadenopathy:     Head:     Right side of head: No tonsillar adenopathy.     Left side of head: No tonsillar adenopathy.     Cervical: No cervical adenopathy.  Skin:    Findings: No erythema or rash.     Nails: There is no clubbing.  Neurological:     Mental Status: He is alert.     Diagnostics:    Spirometry was performed and demonstrated an FEV1 of 2.65 at 79 % of predicted.  Assessment and Plan:   1. Asthma, moderate persistent, well-controlled   2. Other allergic rhinitis   3. LPRD (laryngopharyngeal reflux disease)     1. Continue Symbicort 160 2 inhalations twice a day   2. Continue Accolate 20 mg twice a day  3. Continue Nasonex one spray each nostril 1-2 times a day  4. Continue omeprazole 20 mg daily  5. If needed:    C. Astelin   B. Ventolin HFA  C. Mucinex DM  6. Obtain fall flu vaccine and Covid booster  7. Return to clinic in 12 months or earlier if problem  appears to be doing very well on his current plan and I see no need for altering his medications at this point in time and he will continue on a collection of anti-inflammatory agents for both his upper and lower airway and continue to treat reflux as well and I will see him back in his clinic in 12 months or earlier if there is a problem.  Ross Ranger,  MD Allergy / Immunology [facility]

## 2020-06-02 NOTE — Patient Instructions (Addendum)
  1. Continue Symbicort 160 2 inhalations twice a day   2. Continue Accolate 20 mg twice a day  3. Continue Nasonex one spray each nostril 1-2 times a day  4. Continue omeprazole 20 mg daily  5. If needed:    C. Astelin   B. Ventolin HFA  C. Mucinex DM  6. Obtain fall flu vaccine and Covid booster  7. Return to clinic in 12 months or earlier if problem

## 2020-06-03 ENCOUNTER — Encounter: Payer: Self-pay | Admitting: Allergy and Immunology

## 2020-06-16 ENCOUNTER — Other Ambulatory Visit: Payer: Self-pay | Admitting: Nurse Practitioner

## 2020-07-21 ENCOUNTER — Other Ambulatory Visit: Payer: Self-pay | Admitting: Cardiology

## 2020-07-21 NOTE — Telephone Encounter (Signed)
Refill sent to pharmacy.   

## 2020-07-28 ENCOUNTER — Other Ambulatory Visit: Payer: Self-pay

## 2020-07-28 ENCOUNTER — Ambulatory Visit (INDEPENDENT_AMBULATORY_CARE_PROVIDER_SITE_OTHER): Payer: Medicare Other | Admitting: Internal Medicine

## 2020-07-28 ENCOUNTER — Encounter: Payer: Self-pay | Admitting: Internal Medicine

## 2020-07-28 VITALS — BP 120/74 | HR 52 | Ht 70.0 in | Wt 183.8 lb

## 2020-07-28 DIAGNOSIS — I4819 Other persistent atrial fibrillation: Secondary | ICD-10-CM

## 2020-07-28 DIAGNOSIS — I428 Other cardiomyopathies: Secondary | ICD-10-CM | POA: Diagnosis not present

## 2020-07-28 MED ORDER — METOPROLOL TARTRATE 25 MG PO TABS: ORAL_TABLET | ORAL | 3 refills | Status: DC

## 2020-07-28 NOTE — Progress Notes (Signed)
PCP: Hal Morales, NP Primary Cardiologist: Dr Dulce Sellar Primary EP: Dr Daiva Eves. is a 70 y.o. male who presents today for routine electrophysiology followup.  Since last being seen in our clinic, the patient reports doing reasonably well.  He still has some fatigue.  Today, he denies symptoms of palpitations, chest pain, shortness of breath,  lower extremity edema, dizziness, presyncope, or syncope.  The patient is otherwise without complaint today.   Past Medical History:  Diagnosis Date  . Allergic rhinitis   . Asthma   . LPRD (laryngopharyngeal reflux disease)   . Moderate persistent asthma without complication 04/21/2019  . Other allergic rhinitis 04/21/2019  . Persistent atrial fibrillation [facility])    Past Surgical History:  Procedure Laterality Date  . ADENOIDECTOMY    . ATRIAL FIBRILLATION ABLATION N/A 08/12/2019   Procedure: ATRIAL FIBRILLATION ABLATION;  Surgeon: Hillis Range, MD;  [facility];  Service: Cardiovascular;  Laterality: N/A;  . COLON RESECTION    . HERNIA REPAIR    . SINOSCOPY    . TONSILLECTOMY      ROS- all systems are reviewed and negatives except as per HPI above  Current Outpatient Medications  Medication Sig Dispense Refill  . acetaminophen (TYLENOL) 500 MG tablet Take 1,000 mg by mouth every 6 (six) hours as needed for moderate pain.    Marland Kitchen albuterol (VENTOLIN HFA) 108 (90 Base) MCG/ACT inhaler 2 puffs every 4-6 hours as needed 18 each 2  . azelastine (ASTELIN) 0.1 % nasal spray Can use two sprays in each nostril twice daily if needed. 90 mL 1  . diazepam (VALIUM) 10 MG tablet Take 5 mg by mouth every 6 (six) hours as needed for anxiety.    Marland Kitchen ELIQUIS 5 MG TABS tablet Take 1 tablet (5 mg total) by mouth 2 (two) times daily. 180 tablet 3  . hydrocortisone (ANUSOL-HC) 2.5 % rectal cream Place 1 application rectally daily.    . metoprolol tartrate (LOPRESSOR) 25 MG tablet TAKE 1 TABLET BY MOUTH TWICE A DAY 180 tablet 0  .  mometasone (NASONEX) 50 MCG/ACT nasal spray 1 spray in each nostril 1-2 times daily 51 g 2  . omeprazole (PRILOSEC) 20 MG capsule Take 1 capsule (20 mg total) by mouth daily. 90 capsule 2  . SYMBICORT 160-4.5 MCG/ACT inhaler INHALE 2 PUFFS TWICE DAILY TO PREVENT COUGHING OR WHEEZING. RINSE, GARGLE, AND SPIT AFTER USE. 30.6 each 1  . zafirlukast (ACCOLATE) 20 MG tablet Take 1 tablet (20 mg total) by mouth 2 (two) times daily. 180 tablet 2   No current facility-administered medications for this visit.    Physical Exam: Vitals:   07/28/20 1207  BP: 120/74  Pulse: (!) 52  SpO2: 95%  Weight: 183 lb 12.8 oz (83.4 kg)  Height: 5\' 10"  (1.778 m)     GEN- The patient is well appearing, alert and oriented x 3 today.   Head- normocephalic, atraumatic Eyes-  Sclera clear, conjunctiva pink Ears- hearing intact Oropharynx- clear Lungs- Clear to ausculation bilaterally, normal work of breathing Heart- Regular rate and rhythm, no murmurs, rubs or gallops, PMI not laterally displaced GI- soft, NT, ND, + BS Extremities- no clubbing, cyanosis, or edema, + varicosity on both legs  Wt Readings from Last 3 Encounters:  07/28/20 183 lb 12.8 oz (83.4 kg)  06/02/20 186 lb 12.8 oz (84.7 kg)  01/30/20 178 lb (80.7 kg)    EKG tracing ordered today is personally reviewed and shows sinus,  short PR, incomplete RBBB  Assessment and Plan:  1. Persistent afib Doing well post ablation off AAD therapy Wean metoprolol to off Continue eliquis,  chads2vasc score is 1.  2. Nonischemic CM Tachycardia mediated Resolved with sinus echo 12/21 reviewed  3. Fatigue Wean metoprolol to off  4. varicose veins R leg is tender at times Will refer to Dr Randie Heinz with [facility]  Follow-up in AF clinic in 6 months  Hillis Range MD, [facility] 07/28/2020 12:14 PM

## 2020-07-28 NOTE — Patient Instructions (Addendum)
Medication Instructions:  Reduce Metoprolol Tartrate to 12.5 mg two times a day for 2 weeks. Then Metoprolol Tartrate 25 mg as needed for fast heart rate up to twice a day.   Your physician recommends that you continue on your current medications as directed. Please refer to the Current Medication list given to you today.  Labwork: None ordered.  Testing/Procedures: None ordered.  Follow-Up: Your physician wants you to follow-up in: 6 months with the [facility]. They will contact you to schedule.    Any Other Special Instructions Will Be Listed Below (If Applicable).  If you need a refill on your cardiac medications before your next appointment, please call your pharmacy.

## 2020-07-29 ENCOUNTER — Telehealth: Payer: Self-pay | Admitting: Internal Medicine

## 2020-07-29 MED ORDER — ELIQUIS 5 MG PO TABS: 5.0000 mg | ORAL_TABLET | Freq: Two times a day (BID) | ORAL | 1 refills | Status: DC

## 2020-07-29 NOTE — Telephone Encounter (Signed)
*  STAT* If patient is at the pharmacy, call can be transferred to refill team.   1. Which medications need to be refilled? (please list name of each medication and dose if known)  ELIQUIS 5 MG TABS tablet  2. Which pharmacy/location (including street and city if local pharmacy) is medication to be sent to? [facility] - [place], [place] - [address]  3. Do they need a 30 day or 90 day supply? 90 with refills   [facility] is [facility] to [facility].

## 2020-08-13 ENCOUNTER — Telehealth: Payer: Self-pay | Admitting: Allergy

## 2020-08-13 MED ORDER — DOXYCYCLINE MONOHYDRATE 100 MG PO TABS: 100.0000 mg | ORAL_TABLET | Freq: Two times a day (BID) | ORAL | 0 refills | Status: DC

## 2020-08-13 NOTE — Telephone Encounter (Signed)
Spoke with patient. He is [place] the beach with [place].   Granddaughter had a cold earlier this week and then he developed symptoms with coughing. Today he felt bad and had a fever of 100.2 and now also has some mucous which is green/yellow. He usually gets bronchitis/pneumonia fairly easily. He has a heat monitor due to his afib history and noted slight elevation in HR but he has also been weaning off his metoprolol.  He took 2 covid tests today which were negative. Advised him to swab his throat and then his nose as well - this test came back negative as well. Asked him to repeat testing in 2-3 days if still having symptoms.  I'll send in doxy 100mg  BID x 10 days.

## 2020-09-03 ENCOUNTER — Encounter: Payer: Self-pay | Admitting: *Deleted

## 2020-09-03 ENCOUNTER — Other Ambulatory Visit: Payer: Self-pay | Admitting: Allergy and Immunology

## 2020-10-21 ENCOUNTER — Other Ambulatory Visit: Payer: Self-pay | Admitting: Allergy and Immunology

## 2020-10-24 ENCOUNTER — Other Ambulatory Visit: Payer: Self-pay | Admitting: Allergy and Immunology

## 2020-11-09 ENCOUNTER — Other Ambulatory Visit: Payer: Self-pay | Admitting: Allergy and Immunology

## 2020-11-16 ENCOUNTER — Other Ambulatory Visit: Payer: Self-pay

## 2020-11-16 DIAGNOSIS — I83891 Varicose veins of right lower extremities with other complications: Secondary | ICD-10-CM

## 2020-11-24 ENCOUNTER — Ambulatory Visit (INDEPENDENT_AMBULATORY_CARE_PROVIDER_SITE_OTHER): Payer: Medicare Other | Admitting: Vascular Surgery

## 2020-11-24 ENCOUNTER — Encounter: Payer: Self-pay | Admitting: Vascular Surgery

## 2020-11-24 ENCOUNTER — Other Ambulatory Visit: Payer: Self-pay

## 2020-11-24 ENCOUNTER — Ambulatory Visit (HOSPITAL_COMMUNITY)
Admission: RE | Admit: 2020-11-24 | Discharge: 2020-11-24 | Disposition: A | Discharge status: Home / Self Care | Payer: Medicare Other | Source: Ambulatory Visit | Attending: Vascular Surgery | Admitting: Vascular Surgery

## 2020-11-24 VITALS — BP 143/89 | HR 65 | Temp 98.2°F | Resp 16 | Ht 70.0 in | Wt 182.8 lb

## 2020-11-24 DIAGNOSIS — I872 Venous insufficiency (chronic) (peripheral): Secondary | ICD-10-CM | POA: Diagnosis not present

## 2020-11-24 DIAGNOSIS — I83891 Varicose veins of right lower extremities with other complications: Secondary | ICD-10-CM | POA: Insufficient documentation

## 2020-11-24 DIAGNOSIS — I8393 Asymptomatic varicose veins of bilateral lower extremities: Secondary | ICD-10-CM

## 2020-11-24 DIAGNOSIS — I83813 Varicose veins of bilateral lower extremities with pain: Secondary | ICD-10-CM | POA: Diagnosis not present

## 2020-11-24 NOTE — Progress Notes (Signed)
ASSESSMENT & PLAN   CHRONIC VENOUS INSUFFICIENCY: This patient has CEAP C4c venous disease.  We have discussed the importance of intermittent leg elevation and the proper positioning for this.  In addition I have fitted him for thigh-high compression stockings with a gradient of 20 to 30 mmHg.  I have encouraged him to avoid prolonged sitting and standing.  We have discussed importance of exercise specifically walking and water aerobics.  I will plan on seeing him back in 3 months.  If his symptoms do not improve significantly I think he would be a good candidate for laser ablation of the right great saphenous vein from the distal thigh to the saphenofemoral junction.  He would also require 10-20 stab phlebectomies.  I will see him back in 3 months.  He knows to call sooner if he has problems.  REASON FOR CONSULT:    Painful varicose veins of the right lower extremity.  The consult is requested by Dr. Hillis Range.  HPI:   Ross Robinson. is a 70 y.o. male who presents with painful varicose veins of the right lower extremity.  He has had a long history of varicose veins.  He was going to be seen in our office prior to COVID but his visit was delayed for multiple reasons and now he is coming in for the first time.  Does he has had symptoms for at least 2 years.  He describes aching pain heaviness and a tired feeling in his legs especially on the right.  His symptoms are aggravated by sitting and standing and relieved with elevation.  His symptoms are worse at the end of the day.  His job requires him to sit for long periods of time and that is when his legs seem to hurt him the most.  He has not had any previous vein procedures.  He has had no previous history of DVT.  He does wear knee-high compression stockings when he travels.  He does elevate his legs some.  He has not required ibuprofen for pain.  He does have a history of atrial fibrillation and underwent a successful ablation procedure.  He is  maintained on Eliquis.  Past Medical History:  Diagnosis Date   Allergic rhinitis    Asthma    LPRD (laryngopharyngeal reflux disease)    Moderate persistent asthma without complication 04/21/2019   Other allergic rhinitis 04/21/2019   Persistent atrial fibrillation (HCC)     Family History  Adopted: Yes  Problem Relation Age of Onset   Allergic rhinitis Neg Hx    Angioedema Neg Hx    Asthma Neg Hx    Atopy Neg Hx    Eczema Neg Hx    Immunodeficiency Neg Hx    Urticaria Neg Hx     SOCIAL HISTORY: Social History   Tobacco Use   Smoking status: Never   Smokeless tobacco: Never  Substance Use Topics   Alcohol use: Yes    Alcohol/week: 0.0 standard drinks    Comment: none recently    Allergies  Allergen Reactions   Codeine Nausea And Vomiting   Biaxin [Clarithromycin] Rash   Ceftin [Cefuroxime Axetil] Rash    Current Outpatient Medications  Medication Sig Dispense Refill   acetaminophen (TYLENOL) 500 MG tablet Take 1,000 mg by mouth every 6 (six) hours as needed for moderate pain.     albuterol (VENTOLIN HFA) 108 (90 Base) MCG/ACT inhaler inhale 2 puffs every 4 to 6 hours as needed 18 g 1  azelastine (ASTELIN) 0.1 % nasal spray Place 2 sprays into both nostrils 2 (two) times daily. Use in each nostril as directed     diazepam (VALIUM) 10 MG tablet Take 5 mg by mouth every 6 (six) hours as needed for anxiety.     ELIQUIS 5 MG TABS tablet Take 1 tablet (5 mg total) by mouth 2 (two) times daily. 180 tablet 1   hydrocortisone (ANUSOL-HC) 2.5 % rectal cream Place 1 application rectally daily.     metoprolol tartrate (LOPRESSOR) 25 MG tablet Take 0.5 tablets (12.5 mg total) by mouth 2 (two) times daily for 14 days, THEN 1 tablet (25 mg total) 2 (two) times daily as needed (for break through fast heart rates). 45 tablet 3   mometasone (NASONEX) 50 MCG/ACT nasal spray Place 1 spray into the nose 2 (two) times daily. Two sprays each in each nostril 17 g 5   omeprazole (PRILOSEC)  20 MG capsule Take 1 capsule (20 mg total) by mouth daily. 90 capsule 2   SYMBICORT 160-4.5 MCG/ACT inhaler INHALE 2 PUFFS TWICE DAILY TO PREVENT COUGHING OR WHEEZING. RINSE, GARGLE, AND SPIT AFTER USE. 30.6 each 1   zafirlukast (ACCOLATE) 20 MG tablet Take 1 tablet (20 mg total) by mouth 2 (two) times daily. 180 tablet 2   doxycycline (ADOXA) 100 MG tablet Take 1 tablet (100 mg total) by mouth 2 (two) times daily. 20 tablet 0   mometasone (NASONEX) 50 MCG/ACT nasal spray 1 spray in each nostril 1-2 times daily 51 g 2   No current facility-administered medications for this visit.    REVIEW OF SYSTEMS:  [X]  denotes positive finding, [ ]  denotes negative finding Cardiac  Comments:  Chest pain or chest pressure:    Shortness of breath upon exertion:    Short of breath when lying flat:    Irregular heart rhythm:        Vascular    Pain in calf, thigh, or hip brought on by ambulation:    Pain in feet at night that wakes you up from your sleep:     Blood clot in your veins:    Leg swelling:         Pulmonary    Oxygen at home:    Productive cough:     Wheezing:         Neurologic    Sudden weakness in arms or legs:     Sudden numbness in arms or legs:     Sudden onset of difficulty speaking or slurred speech:    Temporary loss of vision in one eye:     Problems with dizziness:         Gastrointestinal    Blood in stool:     Vomited blood:         Genitourinary    Burning when urinating:     Blood in urine:        Psychiatric    Major depression:         Hematologic    Bleeding problems:    Problems with blood clotting too easily:        Skin    Rashes or ulcers:        Constitutional    Fever or chills:    -  PHYSICAL EXAM:   Vitals:   11/24/20 1441  BP: (!) 143/89  Pulse: 65  Resp: 16  Temp: 98.2 F (36.8 C)  TempSrc: Temporal  SpO2: 97%  Weight: 182 lb 12.8 oz (  82.9 kg)  Height: 5\' 10"  (1.778 m)   Body mass index is 26.23 kg/m. GENERAL: The patient  is a well-nourished male, in no acute distress. The vital signs are documented above. CARDIAC: There is a regular rate and rhythm.  VASCULAR: I do not detect carotid bruits. On the right side he has a palpable posterior tibial pulse.  He has a brisk lateral tarsal signal with the Doppler. On the left side I cannot palpate pedal pulses.  He does have a fairly brisk posterior tibial signal with a Doppler.  I could not obtain a dorsalis pedis signal or anterior tibial signal on the left. He has dilated varicose veins in his proximal medial right thigh, his distal medial right thigh, and is right calf as documented in the photographs below.  He also has corona phlebectatica bilaterally.         Of note I did look at his small saphenous vein myself with the SonoSite the vein is sclerotic and thickened and is not especially dilated.  I did look at the right great saphenous vein myself with the SonoSite and the vein is dilated with reflux down to the distal thigh.  Of note the cluster of varicose veins in his very proximal right thigh appears to originate off the proximal right great saphenous vein.  PULMONARY: There is good air exchange bilaterally without wheezing or rales. ABDOMEN: Soft and non-tender with normal pitched bowel sounds.  MUSCULOSKELETAL: There are no major deformities. NEUROLOGIC: No focal weakness or paresthesias are detected. SKIN: There are no ulcers or rashes noted. PSYCHIATRIC: The patient has a normal affect.  DATA:    VENOUS DUPLEX: I have independently interpreted his venous duplex scan today.  This was of the right lower extremity only.  There was no evidence of deep venous thrombosis on the right.  There was deep venous reflux involving the common femoral vein and femoral vein.  There was superficial venous reflux in the right great saphenous vein from the saphenofemoral junction to the knee.  Diameters of the vein in the thigh ranged from 5 to 7 mm.  There was  superficial venous reflux in the right small saphenous vein to the proximal calf.  The vein was about 5 mm in diameter.  Of note the patient had a vein of Giacomini with a connection to the popliteal vein.     [facility]

## 2020-11-28 ENCOUNTER — Other Ambulatory Visit: Payer: Self-pay | Admitting: Allergy and Immunology

## 2020-11-29 ENCOUNTER — Other Ambulatory Visit: Payer: Self-pay | Admitting: Allergy and Immunology

## 2020-12-18 ENCOUNTER — Other Ambulatory Visit: Payer: Self-pay | Admitting: Allergy and Immunology

## 2021-01-08 ENCOUNTER — Other Ambulatory Visit: Payer: Self-pay | Admitting: Allergy and Immunology

## 2021-01-27 ENCOUNTER — Other Ambulatory Visit: Payer: Self-pay

## 2021-01-27 ENCOUNTER — Encounter (HOSPITAL_COMMUNITY): Payer: Self-pay | Admitting: Nurse Practitioner

## 2021-01-27 ENCOUNTER — Ambulatory Visit (HOSPITAL_COMMUNITY)
Admission: RE | Admit: 2021-01-27 | Discharge: 2021-01-27 | Disposition: A | Discharge status: Home / Self Care | Payer: Medicare Other | Source: Ambulatory Visit | Attending: Nurse Practitioner | Admitting: Nurse Practitioner

## 2021-01-27 VITALS — BP 146/76 | HR 55 | Ht 70.0 in | Wt 180.6 lb

## 2021-01-27 DIAGNOSIS — Z79899 Other long term (current) drug therapy: Secondary | ICD-10-CM | POA: Insufficient documentation

## 2021-01-27 DIAGNOSIS — D6869 Other thrombophilia: Secondary | ICD-10-CM | POA: Diagnosis not present

## 2021-01-27 DIAGNOSIS — I4819 Other persistent atrial fibrillation: Secondary | ICD-10-CM | POA: Diagnosis present

## 2021-01-27 DIAGNOSIS — I4891 Unspecified atrial fibrillation: Secondary | ICD-10-CM

## 2021-01-27 DIAGNOSIS — Z7901 Long term (current) use of anticoagulants: Secondary | ICD-10-CM | POA: Diagnosis not present

## 2021-01-27 LAB — CBC
HCT: 43.4 % (ref 39.0–52.0)
Hemoglobin: 14.8 g/dL (ref 13.0–17.0)
MCH: 33.9 pg (ref 26.0–34.0)
MCHC: 34.1 g/dL (ref 30.0–36.0)
MCV: 99.3 fL (ref 80.0–100.0)
Platelets: 216 10*3/uL (ref 150–400)
RBC: 4.37 MIL/uL (ref 4.22–5.81)
RDW: 11.8 % (ref 11.5–15.5)
WBC: 5.5 10*3/uL (ref 4.0–10.5)
nRBC: 0 % (ref 0.0–0.2)

## 2021-01-27 MED ORDER — METOPROLOL TARTRATE 25 MG PO TABS: ORAL_TABLET | ORAL | Status: DC

## 2021-01-27 NOTE — Progress Notes (Incomplete Revision)
Primary Care Physician: Hal Morales, NP Referring Physician:Dr. Loletha Grayer Anel Purohit. is a 70 y.o. male with a h/o persistent afib s/p ablation 07/2019.He  has not noted any afib, had completely stopped Cardizem and metoprolol, noted some palpitations, elevated HR and restarted metoprolol 12.5 mg bid and feels better overall being back on it.    Being compliant with eliquis 5 mg bid for CHA2DS2VASc score of 2.   Today, he denies symptoms of palpitations, chest pain, shortness of breath, orthopnea, PND, lower extremity edema, dizziness, presyncope, syncope, or neurologic sequela. The patient is tolerating medications without difficulties and is otherwise without complaint today.   Past Medical History:  Diagnosis Date   Allergic rhinitis    Asthma    LPRD (laryngopharyngeal reflux disease)    Moderate persistent asthma without complication 04/21/2019   Other allergic rhinitis 04/21/2019   Persistent atrial fibrillation (HCC)    Past Surgical History:  Procedure Laterality Date   ADENOIDECTOMY     ATRIAL FIBRILLATION ABLATION N/A 08/12/2019   Procedure: ATRIAL FIBRILLATION ABLATION;  Surgeon: Hillis Range, MD;  Location: [facility];  Service: Cardiovascular;  Laterality: N/A;   COLON RESECTION     HERNIA REPAIR     SINOSCOPY     TONSILLECTOMY      Current Outpatient Medications  Medication Sig Dispense Refill   acetaminophen (TYLENOL) 500 MG tablet Take 1,000 mg by mouth every 6 (six) hours as needed for moderate pain.     albuterol (VENTOLIN HFA) 108 (90 Base) MCG/ACT inhaler inhale 2 puffs every 4 to 6 hours as needed 18 g 1   azelastine (ASTELIN) 0.1 % nasal spray Place 2 sprays into both nostrils 2 (two) times daily. Use in each nostril as directed     diazepam (VALIUM) 10 MG tablet Take 5 mg by mouth every 6 (six) hours as needed for anxiety.     ELIQUIS 5 MG TABS tablet Take 1 tablet (5 mg total) by mouth 2 (two) times daily. 180 tablet 1   hydrocortisone  (ANUSOL-HC) 2.5 % rectal cream Place 1 application rectally daily.     metoprolol tartrate (LOPRESSOR) 25 MG tablet Take 0.5 tablets (12.5 mg total) by mouth 2 (two) times daily for 14 days, THEN 1 tablet (25 mg total) 2 (two) times daily as needed (for break through fast heart rates). 45 tablet 3   mometasone (NASONEX) 50 MCG/ACT nasal spray Place 1 spray into the nose 2 (two) times daily. Two sprays each in each nostril 17 g 5   omeprazole (PRILOSEC) 20 MG capsule Take 1 capsule (20 mg total) by mouth daily. 90 capsule 2   SYMBICORT 160-4.5 MCG/ACT inhaler INHALE 2 PUFFS TWICE DAILY TO PREVENT COUGHING OR WHEEZING. RINSE, GARGLE, AND SPIT AFTER USE. 30.6 each 1   zafirlukast (ACCOLATE) 20 MG tablet Take 1 tablet (20 mg total) by mouth 2 (two) times daily. 180 tablet 2   No current facility-administered medications for this encounter.    Allergies  Allergen Reactions   Codeine Nausea And Vomiting   Biaxin [Clarithromycin] Rash   Ceftin [Cefuroxime Axetil] Rash    Social History   Socioeconomic History   Marital status: Married    Spouse name: Not on file   Number of children: Not on file   Years of education: Not on file   Highest education level: Not on file  Occupational History   Not on file  Tobacco Use   Smoking status: Never  Smokeless tobacco: Never  Vaping Use   Vaping Use: Never used  Substance and Sexual Activity   Alcohol use: Yes    Alcohol/week: 0.0 standard drinks    Comment: none recently   Drug use: No   Sexual activity: Not Currently  Other Topics Concern   Not on file  Social History Narrative   Lives in [place] with wife.   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[employer], water quality- land use and zoning   Social Determinants of Health   Financial Resource Strain: Not on file  Food Insecurity: Not on file  Transportation Needs: Not on file  Physical Activity: Not on file  Stress: Not on file  Social Connections: Not on  file  Intimate Partner Violence: Not on file    Family History  Adopted: Yes  Problem Relation Age of Onset   Allergic rhinitis Neg Hx    Angioedema Neg Hx    Asthma Neg Hx    Atopy Neg Hx    Eczema Neg Hx    Immunodeficiency Neg Hx    Urticaria Neg Hx     ROS- All systems are reviewed and negative except as per the HPI above  Physical Exam: Vitals:   01/27/21 1426  Weight: 81.9 kg  Height: 5\' 10"  (1.778 m)   Wt Readings from Last 3 Encounters:  01/27/21 81.9 kg  11/24/20 82.9 kg  07/28/20 83.4 kg    Labs: Lab Results  Component Value Date   NA 145 (H) 07/29/2019   K 4.1 07/29/2019   CL 107 (H) 07/29/2019   CO2 23 07/29/2019   GLUCOSE 83 07/29/2019   BUN 24 07/29/2019   CREATININE 1.02 07/29/2019   CALCIUM 8.9 07/29/2019   No results found for: INR No results found for: CHOL, HDL, LDLCALC, TRIG   GEN- The patient is well appearing, alert and oriented x 3 today.   Head- normocephalic, atraumatic Eyes-  Sclera clear, conjunctiva pink Ears- hearing intact Oropharynx- clear Neck- supple, no JVP Lymph- no cervical lymphadenopathy Lungs- Clear to ausculation bilaterally, normal work of breathing Heart- Regular rate and rhythm, no murmurs, rubs or gallops, PMI not laterally displaced GI- soft, NT, ND, + BS Extremities- no clubbing, cyanosis, or edema MS- no significant deformity or atrophy Skin- no rash or lesion Psych- euthymic mood, full affect Neuro- strength and sensation are intact  EKG sinus brady at 55 bpm,  pr int 132 ms, qrs int 112 ms, qtc 405 ms     Assessment and Plan: 1. afib  No afib since afib ablation that pt has appreciated  Tracks with [employer] mobile Continue  metoprolol 25 mg 1/2 tab bid     2. CHA2DS2VASc score of 2 Continue eliquis 5 mg bid without interruption  F/u  in afib clinic in 9 months   09/28/2019 [facility] [address] [place], [place] [phone]

## 2021-01-27 NOTE — Progress Notes (Signed)
Primary Care Physician: Hal Morales, NP Referring Physician:Dr. Loletha Grayer Julia Kulzer. is a 70 y.o. male with a h/o persistent afib s/p ablation 07/2019. or groin issues. He  has not noted any afib, had completely stopped Cardizem and metoprolol, noted some palpitations, elevated HR and restarted metoprolol 12.5 mg bid and feels better overall being back on it.    Being compliant with eliquis 5 mg bid for CHA2DS2VASc score of 2.   Today, he denies symptoms of palpitations, chest pain, shortness of breath, orthopnea, PND, lower extremity edema, dizziness, presyncope, syncope, or neurologic sequela. The patient is tolerating medications without difficulties and is otherwise without complaint today.   Past Medical History:  Diagnosis Date   Allergic rhinitis    Asthma    LPRD (laryngopharyngeal reflux disease)    Moderate persistent asthma without complication 04/21/2019   Other allergic rhinitis 04/21/2019   Persistent atrial fibrillation (HCC)    Past Surgical History:  Procedure Laterality Date   ADENOIDECTOMY     ATRIAL FIBRILLATION ABLATION N/A 08/12/2019   Procedure: ATRIAL FIBRILLATION ABLATION;  Surgeon: Hillis Range, MD;  Location: [facility];  Service: Cardiovascular;  Laterality: N/A;   COLON RESECTION     HERNIA REPAIR     SINOSCOPY     TONSILLECTOMY      Current Outpatient Medications  Medication Sig Dispense Refill   acetaminophen (TYLENOL) 500 MG tablet Take 1,000 mg by mouth every 6 (six) hours as needed for moderate pain.     albuterol (VENTOLIN HFA) 108 (90 Base) MCG/ACT inhaler inhale 2 puffs every 4 to 6 hours as needed 18 g 1   azelastine (ASTELIN) 0.1 % nasal spray Place 2 sprays into both nostrils 2 (two) times daily. Use in each nostril as directed     diazepam (VALIUM) 10 MG tablet Take 5 mg by mouth every 6 (six) hours as needed for anxiety.     ELIQUIS 5 MG TABS tablet Take 1 tablet (5 mg total) by mouth 2 (two) times daily. 180 tablet 1    hydrocortisone (ANUSOL-HC) 2.5 % rectal cream Place 1 application rectally daily.     metoprolol tartrate (LOPRESSOR) 25 MG tablet Take 0.5 tablets (12.5 mg total) by mouth 2 (two) times daily for 14 days, THEN 1 tablet (25 mg total) 2 (two) times daily as needed (for break through fast heart rates). 45 tablet 3   mometasone (NASONEX) 50 MCG/ACT nasal spray Place 1 spray into the nose 2 (two) times daily. Two sprays each in each nostril 17 g 5   omeprazole (PRILOSEC) 20 MG capsule Take 1 capsule (20 mg total) by mouth daily. 90 capsule 2   SYMBICORT 160-4.5 MCG/ACT inhaler INHALE 2 PUFFS TWICE DAILY TO PREVENT COUGHING OR WHEEZING. RINSE, GARGLE, AND SPIT AFTER USE. 30.6 each 1   zafirlukast (ACCOLATE) 20 MG tablet Take 1 tablet (20 mg total) by mouth 2 (two) times daily. 180 tablet 2   No current facility-administered medications for this encounter.    Allergies  Allergen Reactions   Codeine Nausea And Vomiting   Biaxin [Clarithromycin] Rash   Ceftin [Cefuroxime Axetil] Rash    Social History   Socioeconomic History   Marital status: Married    Spouse name: Not on file   Number of children: Not on file   Years of education: Not on file   Highest education level: Not on file  Occupational History   Not on file  Tobacco Use  Smoking status: Never   Smokeless tobacco: Never  Vaping Use   Vaping Use: Never used  Substance and Sexual Activity   Alcohol use: Yes    Alcohol/week: 0.0 standard drinks    Comment: none recently   Drug use: No   Sexual activity: Not Currently  Other Topics Concern   Not on file  Social History Narrative   Lives in [place] with wife.   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[employer], water quality- land use and zoning   Social Determinants of Health   Financial Resource Strain: Not on file  Food Insecurity: Not on file  Transportation Needs: Not on file  Physical Activity: Not on file  Stress: Not on file  Social  Connections: Not on file  Intimate Partner Violence: Not on file    Family History  Adopted: Yes  Problem Relation Age of Onset   Allergic rhinitis Neg Hx    Angioedema Neg Hx    Asthma Neg Hx    Atopy Neg Hx    Eczema Neg Hx    Immunodeficiency Neg Hx    Urticaria Neg Hx     ROS- All systems are reviewed and negative except as per the HPI above  Physical Exam: Vitals:   01/27/21 1426  Weight: 81.9 kg  Height: 5\' 10"  (1.778 m)   Wt Readings from Last 3 Encounters:  01/27/21 81.9 kg  11/24/20 82.9 kg  07/28/20 83.4 kg    Labs: Lab Results  Component Value Date   NA 145 (H) 07/29/2019   K 4.1 07/29/2019   CL 107 (H) 07/29/2019   CO2 23 07/29/2019   GLUCOSE 83 07/29/2019   BUN 24 07/29/2019   CREATININE 1.02 07/29/2019   CALCIUM 8.9 07/29/2019   No results found for: INR No results found for: CHOL, HDL, LDLCALC, TRIG   GEN- The patient is well appearing, alert and oriented x 3 today.   Head- normocephalic, atraumatic Eyes-  Sclera clear, conjunctiva pink Ears- hearing intact Oropharynx- clear Neck- supple, no JVP Lymph- no cervical lymphadenopathy Lungs- Clear to ausculation bilaterally, normal work of breathing Heart- Regular rate and rhythm, no murmurs, rubs or gallops, PMI not laterally displaced GI- soft, NT, ND, + BS Extremities- no clubbing, cyanosis, or edema MS- no significant deformity or atrophy Skin- no rash or lesion Psych- euthymic mood, full affect Neuro- strength and sensation are intact  EKG sinus brady at 55 bpm,  pr int 132 ms, qrs int 112 ms, qtc 405 ms     Assessment and Plan: 1. afib  No afib since afib ablation that pt has appreciated  Tracks with K[employer]ia mobile Continue  metoprolol 25 mg 1/2 tab bid     2. CHA2DS2VASc score of 2 Continue eliquis 5 mg bid without interruption  F/u  in afib clinic in 9 months   09/28/2019 [facility] [address] [place], [place]  [place] [phone]

## 2021-01-29 ENCOUNTER — Other Ambulatory Visit: Payer: Self-pay | Admitting: Allergy and Immunology

## 2021-03-09 ENCOUNTER — Ambulatory Visit (INDEPENDENT_AMBULATORY_CARE_PROVIDER_SITE_OTHER): Payer: Medicare Other | Admitting: Vascular Surgery

## 2021-03-09 ENCOUNTER — Other Ambulatory Visit: Payer: Self-pay

## 2021-03-09 ENCOUNTER — Encounter: Payer: Self-pay | Admitting: Vascular Surgery

## 2021-03-09 VITALS — BP 145/83 | HR 61 | Temp 98.5°F | Resp 16 | Ht 68.5 in | Wt 183.0 lb

## 2021-03-09 DIAGNOSIS — I872 Venous insufficiency (chronic) (peripheral): Secondary | ICD-10-CM | POA: Diagnosis not present

## 2021-03-09 DIAGNOSIS — I83813 Varicose veins of bilateral lower extremities with pain: Secondary | ICD-10-CM

## 2021-03-09 NOTE — Progress Notes (Signed)
REASON FOR VISIT:   Follow-up of chronic venous insufficiency  MEDICAL ISSUES:   CHRONIC VENOUS INSUFFICIENCY: This patient has CEAP C4c venous disease.  The patient continues to have significant aching and heaviness in the right leg which is aggravated by sitting and relieved with elevation.  His thigh-high compressions do help some.  However given that he is having persistent symptoms I think he would be a good candidate for laser ablation of the right great saphenous vein with greater than 20 stab phlebectomies.  I would like to cannulate the vein in the distal thigh.  I have discussed the indications for endovenous laser ablation of the right GSV, that is to lower the pressure in the veins and potentially help relieve the symptoms from venous hypertension.  I have also discussed alternative options such as conservative treatment as described above. I have discussed the potential complications of the procedure, including bleeding, bruising, leg swelling, deep venous thrombosis (<1% risk), or failure of the vein to close <1% risk).  I have also explained that venous insufficiency is a chronic disease, and that the patient is at risk for recurrent varicose veins in the future.  All of the patient's questions were encouraged and answered. They are agreeable to proceed.    have discussed with the patient the indications for stab phlebectomy.  I have explained to the patient that that will have small scars from the stab incisions.  I explained that the other risks include bruising, bleeding, and phlebitis.   He is trying to plan a trip to [place] to see his grandchildren and I have instructed him that I would not want him to fly 3 to 4 weeks after the procedure.  In addition he is on Eliquis and is undergone a previous ablation procedure by Dr. Tillie Rung.  We will check to be sure that it safe to stop his Eliquis prior to the procedure.   HPI:   Ross Robinson. is a pleasant 71 y.o. male who I saw  on 11/24/2020 with chronic venous insufficiency.  He has CEAP C4c venous disease.  He had presented with painful varicose veins of the right lower extremity.  He has had symptoms for over 2 years.  Since I saw him last he tells me that his thigh-high compression stockings do help with his symptoms.  However he is continuing to have some aching pain and heaviness in both legs but especially on the right side.  His symptoms are aggravated by sitting and standing and relieved with elevation.  He has no previous history of DVT.  Past Medical History:  Diagnosis Date   Allergic rhinitis    Asthma    LPRD (laryngopharyngeal reflux disease)    Moderate persistent asthma without complication 04/21/2019   Other allergic rhinitis 04/21/2019   Persistent atrial fibrillation (HCC)     Family History  Adopted: Yes  Problem Relation Age of Onset   Allergic rhinitis Neg Hx    Angioedema Neg Hx    Asthma Neg Hx    Atopy Neg Hx    Eczema Neg Hx    Immunodeficiency Neg Hx    Urticaria Neg Hx     SOCIAL HISTORY: Social History   Tobacco Use   Smoking status: Never   Smokeless tobacco: Never  Substance Use Topics   Alcohol use: Yes    Alcohol/week: 0.0 standard drinks    Comment: none recently    Allergies  Allergen Reactions   Codeine Nausea And Vomiting  Biaxin [Clarithromycin] Rash   Ceftin [Cefuroxime Axetil] Rash    Current Outpatient Medications  Medication Sig Dispense Refill   acetaminophen (TYLENOL) 500 MG tablet Take 1,000 mg by mouth every 6 (six) hours as needed for moderate pain.     albuterol (VENTOLIN HFA) 108 (90 Base) MCG/ACT inhaler inhale 2 puffs by mouth every 4 to 6 hours as needed 18 g 1   azelastine (ASTELIN) 0.1 % nasal spray Place 2 sprays into both nostrils 2 (two) times daily. Use in each nostril as directed     diazepam (VALIUM) 10 MG tablet Take 5 mg by mouth every 6 (six) hours as needed for anxiety.     ELIQUIS 5 MG TABS tablet Take 1 tablet (5 mg total) by  mouth 2 (two) times daily. 180 tablet 1   hydrocortisone (ANUSOL-HC) 2.5 % rectal cream Place 1 application rectally daily.     metoprolol tartrate (LOPRESSOR) 25 MG tablet Take 1/2 tablet by mouth twice daily for fast heart rates     mometasone (NASONEX) 50 MCG/ACT nasal spray Place 1 spray into the nose 2 (two) times daily. Two sprays each in each nostril 17 g 5   omeprazole (PRILOSEC) 20 MG capsule Take 1 capsule (20 mg total) by mouth daily. 90 capsule 2   SYMBICORT 160-4.5 MCG/ACT inhaler INHALE 2 PUFFS TWICE DAILY TO PREVENT COUGHING OR WHEEZING. RINSE, GARGLE, AND SPIT AFTER USE. 30.6 each 1   zafirlukast (ACCOLATE) 20 MG tablet Take 1 tablet (20 mg total) by mouth 2 (two) times daily. 180 tablet 2   No current facility-administered medications for this visit.    REVIEW OF SYSTEMS:  [X]  denotes positive finding, [ ]  denotes negative finding Cardiac  Comments:  Chest pain or chest pressure:    Shortness of breath upon exertion:    Short of breath when lying flat:    Irregular heart rhythm:        Vascular    Pain in calf, thigh, or hip brought on by ambulation:    Pain in feet at night that wakes you up from your sleep:     Blood clot in your veins:    Leg swelling:         Pulmonary    Oxygen at home:    Productive cough:     Wheezing:         Neurologic    Sudden weakness in arms or legs:     Sudden numbness in arms or legs:     Sudden onset of difficulty speaking or slurred speech:    Temporary loss of vision in one eye:     Problems with dizziness:         Gastrointestinal    Blood in stool:     Vomited blood:         Genitourinary    Burning when urinating:     Blood in urine:        Psychiatric    Major depression:         Hematologic    Bleeding problems:    Problems with blood clotting too easily:        Skin    Rashes or ulcers:        Constitutional    Fever or chills:     PHYSICAL EXAM:   Vitals:   03/09/21 1319  BP: (!) 145/83  Pulse:  61  Resp: 16  Temp: 98.5 F (36.9 C)  TempSrc: Temporal  SpO2: 95%  Weight: 183 lb (83 kg)  Height: 5' 8.5" (1.74 m)    GENERAL: The patient is a well-nourished male, in no acute distress. The vital signs are documented above. CARDIAC: There is a regular rate and rhythm.  VASCULAR: He has palpable pedal pulses. He has dilated varicose veins in the medial right calf and also a large cluster in his medial right thigh.      I looked at his great saphenous vein myself with the SonoSite.  He has significant reflux down to the knee and I would like to cannulate the vein in the distal thigh.  PULMONARY: There is good air exchange bilaterally without wheezing or rales. ABDOMEN: Soft and non-tender with normal pitched bowel sounds.  MUSCULOSKELETAL: There are no major deformities or cyanosis. NEUROLOGIC: No focal weakness or paresthesias are detected. SKIN: There are no ulcers or rashes noted. PSYCHIATRIC: The patient has a normal affect.  DATA:    VENOUS DUPLEX: I reviewed his venous duplex scan that was done on 11/24/2020.  This showed no evidence of DVT in the right lower extremity.  Of note this was of the right lower extremity only.  He had deep venous reflux involving the common femoral vein and femoral vein.  He had superficial venous reflux in the great saphenous vein from the saphenofemoral junction to the knee.  Diameters of the vein ranged from 5 to 7 mm.     Deitra Mayo [facility] [phone]

## 2021-03-15 ENCOUNTER — Encounter: Payer: Self-pay | Admitting: Vascular Surgery

## 2021-03-15 ENCOUNTER — Other Ambulatory Visit: Payer: Self-pay | Admitting: *Deleted

## 2021-03-15 DIAGNOSIS — I83811 Varicose veins of right lower extremities with pain: Secondary | ICD-10-CM

## 2021-03-15 NOTE — Addendum Note (Signed)
Encounter addended by: Sherran Needs, NP on: 03/15/2021 3:20 PM  Actions taken: Pend clinical note

## 2021-03-19 ENCOUNTER — Other Ambulatory Visit: Payer: Self-pay | Admitting: Allergy and Immunology

## 2021-04-06 ENCOUNTER — Encounter: Payer: Self-pay | Admitting: Vascular Surgery

## 2021-04-06 ENCOUNTER — Ambulatory Visit (INDEPENDENT_AMBULATORY_CARE_PROVIDER_SITE_OTHER): Payer: Medicare Other | Admitting: Vascular Surgery

## 2021-04-06 ENCOUNTER — Other Ambulatory Visit: Payer: Medicare Other | Admitting: Vascular Surgery

## 2021-04-06 ENCOUNTER — Other Ambulatory Visit: Payer: Self-pay

## 2021-04-06 VITALS — BP 130/87 | HR 63 | Temp 98.0°F | Resp 16 | Ht 69.5 in | Wt 190.0 lb

## 2021-04-06 DIAGNOSIS — I872 Venous insufficiency (chronic) (peripheral): Secondary | ICD-10-CM

## 2021-04-06 DIAGNOSIS — I83811 Varicose veins of right lower extremities with pain: Secondary | ICD-10-CM

## 2021-04-06 HISTORY — PX: ENDOVENOUS ABLATION SAPHENOUS VEIN W/ LASER: SUR449

## 2021-04-06 NOTE — Progress Notes (Signed)
° ° °   Laser Ablation Procedure    Date: 04/06/2021   Ross Robinson. DOB:May 30, 1950  Consent signed: Yes      Surgeon: Cari Caraway MD   Procedure: Laser Ablation: right Greater Saphenous Vein  BP 130/87 (BP Location: Right Arm, Patient Position: Sitting, Cuff Size: Normal)    Pulse 63    Temp 98 F (36.7 C) (Temporal)    Resp 16    Ht 5' 9.5" (1.765 m)    Wt 190 lb (86.2 kg)    SpO2 97%    BMI 27.66 kg/m   Tumescent Anesthesia: 650 cc 0.9% NaCl with 50 cc Lidocaine HCL 1%  and 15 cc 8.4% NaHCO3  Local Anesthesia: 10 cc Lidocaine HCL and NaHCO3 (ratio 2:1)  7 watts continuous mode     Total energy: 1253 Joules     Total time: 179 seconds  Treatment Length  28 Cm   Laser Fiber Ref. #  [id number]   Lot #  [id number]   Stab Phlebectomy: > 20  Sites: Thigh and Calf  Patient tolerated procedure well  Notes: Patient wore face mask.  All staff members wore facial masks and facial shields/goggles.  Last dose of Eliquis taken by Ross Robinson on 04-03-2021.    Description of Procedure:  After marking the course of the secondary varicosities, the patient was placed on the operating table in the supine position, and the right leg was prepped and draped in sterile fashion.   Local anesthetic was administered and under ultrasound guidance the saphenous vein was accessed with a micro needle and guide wire; then the mirco puncture sheath was placed.  A guide wire was inserted saphenofemoral junction , followed by a 5 french sheath.  The position of the sheath and then the laser fiber below the junction was confirmed using the ultrasound.  Tumescent anesthesia was administered along the course of the saphenous vein using ultrasound guidance. The patient was placed in Trendelenburg position and protective laser glasses were placed on patient and staff, and the laser was fired at 7 watts continuous mode for a total of 1253 joules.   For stab phlebectomies, local anesthetic was administered at the  previously marked varicosities, and tumescent anesthesia was administered around the vessels.  Greater than 20 stab wounds were made using the tip of an 11 blade. And using the vein hook, the phlebectomies were performed using a hemostat to avulse the varicosities.  Adequate hemostasis was achieved.     Steri strips were applied to the stab wounds and ABD pads and thigh high compression stockings were applied.  Ace wrap bandages were applied over the phlebectomy sites and at the top of the saphenofemoral junction. Blood loss was less than 15 cc.  Discharge instructions reviewed with patient and hardcopy of discharge instructions given to patient to take home. The patient ambulated out of the operating room having tolerated the procedure well.

## 2021-04-06 NOTE — Progress Notes (Signed)
Patient name: Ross Robinson. MRN: [id number] DOB: October 26, 1950 Sex: male  REASON FOR VISIT: For laser ablation right great saphenous vein and greater than 20 stabs  HPI: [name]. is a 71 y.o. male who presented with CEAP C4c venous disease.  He had failed conservative treatment and was felt to be a good candidate for laser ablation of the right great saphenous vein greater than 20 stabs.  He comes in today for the procedure.  Current Outpatient Medications  Medication Sig Dispense Refill   acetaminophen (TYLENOL) 500 MG tablet Take 1,000 mg by mouth every 6 (six) hours as needed for moderate pain.     albuterol (VENTOLIN HFA) 108 (90 Base) MCG/ACT inhaler inhale 2 puffs by mouth every 4 to 6 hours as needed 18 g 1   azelastine (ASTELIN) 0.1 % nasal spray Place 2 sprays into both nostrils 2 (two) times daily. Use in each nostril as directed     diazepam (VALIUM) 10 MG tablet Take 5 mg by mouth every 6 (six) hours as needed for anxiety.     hydrocortisone (ANUSOL-HC) 2.5 % rectal cream Place 1 application rectally daily.     metoprolol tartrate (LOPRESSOR) 25 MG tablet Take 1/2 tablet by mouth twice daily for fast heart rates     mometasone (NASONEX) 50 MCG/ACT nasal spray Place 1 spray into the nose 2 (two) times daily. Two sprays each in each nostril 17 g 5   omeprazole (PRILOSEC) 20 MG capsule Take 1 capsule (20 mg total) by mouth daily. 90 capsule 2   SYMBICORT 160-4.5 MCG/ACT inhaler INHALE 2 PUFFS TWICE DAILY TO PREVENT COUGHING OR WHEEZING. RINSE, GARGLE, AND SPIT AFTER USE. 30.6 g 1   zafirlukast (ACCOLATE) 20 MG tablet Take 1 tablet (20 mg total) by mouth 2 (two) times daily. 180 tablet 2   ELIQUIS 5 MG TABS tablet Take 1 tablet (5 mg total) by mouth 2 (two) times daily. (Patient not taking: Reported on 04/06/2021) 180 tablet 1   No current facility-administered medications for this visit.    PHYSICAL EXAM: Vitals:   04/06/21 0831  BP: 130/87  Pulse: 63  Resp: 16  Temp:  98 F (36.7 C)  TempSrc: Temporal  SpO2: 97%  Weight: 190 lb (86.2 kg)  Height: 5' 9.5" (1.765 m)    PROCEDURE: Laser ablation right great saphenous vein with greater than 20 stabs.  TECHNIQUE: The patient was taken to the exam room and the dilated varicose veins were marked with the patient standing.  He was then placed supine.  I looked at the right great saphenous vein myself with the SonoSite and I felt that I could cannulate this in the distal thigh.  The right leg was prepped and draped in usual sterile fashion.  Under ultrasound guidance, after the skin was anesthetized, I cannulated the right great saphenous vein in the distal thigh with a micropuncture needle and a micropuncture sheath was introduced over the wire.  I then advanced the J-wire to below the saphenofemoral junction.  The 45 cm sheath was advanced over the wire and the wire and dilator removed.  The laser fiber was positioned at the end of the sheath and the sheath retracted.  The laser fiber was positioned 2.5 cm distal to the saphenofemoral junction and distal to the takeoff of the superficial epigastric vein.  Next tumescent anesthesia was administered circumferentially around the vein.  Laser ablation was performed of the right great saphenous vein from 2-1/2 cm distal  to the saphenofemoral junction to the distal thigh.  50 J/cm was used at 7 W.  Attention was then turned to stab phlebectomies.  All the marked areas were anesthetized with tumescent anesthesia.  Approximately 25 small stab incisions were made with an 11 blade.  The vein was hooked and then brought above the skin and grasped with a hemostat.  It was then bluntly excised.  Pressure was held for hemostasis.  Steri-Strips were applied.  A pressure dressing was applied.  The patient tolerated the procedure well.  He will return on March 1 for a follow-up duplex.  Waverly Ferrari [facility] [phone]

## 2021-04-13 ENCOUNTER — Encounter: Payer: Medicare Other | Admitting: Vascular Surgery

## 2021-04-13 ENCOUNTER — Encounter (HOSPITAL_COMMUNITY): Payer: Medicare Other

## 2021-04-20 ENCOUNTER — Encounter: Payer: Self-pay | Admitting: Vascular Surgery

## 2021-04-20 ENCOUNTER — Ambulatory Visit (INDEPENDENT_AMBULATORY_CARE_PROVIDER_SITE_OTHER): Payer: Medicare Other | Admitting: Vascular Surgery

## 2021-04-20 ENCOUNTER — Other Ambulatory Visit: Payer: Self-pay

## 2021-04-20 ENCOUNTER — Other Ambulatory Visit: Payer: Medicare Other | Admitting: Vascular Surgery

## 2021-04-20 ENCOUNTER — Ambulatory Visit (HOSPITAL_COMMUNITY)
Admission: RE | Admit: 2021-04-20 | Discharge: 2021-04-20 | Disposition: A | Discharge status: Home / Self Care | Payer: Medicare Other | Source: Ambulatory Visit | Attending: Vascular Surgery | Admitting: Vascular Surgery

## 2021-04-20 VITALS — BP 134/86 | HR 52 | Temp 98.2°F | Resp 18 | Ht 69.5 in | Wt 186.0 lb

## 2021-04-20 DIAGNOSIS — I872 Venous insufficiency (chronic) (peripheral): Secondary | ICD-10-CM

## 2021-04-20 DIAGNOSIS — I83811 Varicose veins of right lower extremities with pain: Secondary | ICD-10-CM | POA: Insufficient documentation

## 2021-04-20 NOTE — Progress Notes (Signed)
? ?Patient name: Ross Robinson. MRN: [id number] DOB: May 15, 1950 Sex: male ? ?REASON FOR VISIT: Follow-up after laser ablation of the right great saphenous vein and greater than 20 stabs ? ?HPI: ?Ross Robinson. is a 71 y.o. male  who presented with CEAP C4c venous disease.  He had failed conservative treatment and was felt to be a good candidate for laser ablation and stab phlebectomies.  He underwent the procedure on 04/06/2021.  He comes in for a 2-week follow-up visit. ? ?He has no specific complaints.  Some of his Steri-Strips have fallen off.  He is worn his thigh-high stockings now for 2 weeks.  He has been walking and exercising. ? ?Current Outpatient Medications  ?Medication Sig Dispense Refill  ? acetaminophen (TYLENOL) 500 MG tablet Take 1,000 mg by mouth every 6 (six) hours as needed for moderate pain.    ? albuterol (VENTOLIN HFA) 108 (90 Base) MCG/ACT inhaler inhale 2 puffs by mouth every 4 to 6 hours as needed 18 g 1  ? azelastine (ASTELIN) 0.1 % nasal spray Place 2 sprays into both nostrils 2 (two) times daily. Use in each nostril as directed    ? diazepam (VALIUM) 10 MG tablet Take 5 mg by mouth every 6 (six) hours as needed for anxiety.    ? ELIQUIS 5 MG TABS tablet Take 1 tablet (5 mg total) by mouth 2 (two) times daily. 180 tablet 1  ? hydrocortisone (ANUSOL-HC) 2.5 % rectal cream Place 1 application rectally daily.    ? metoprolol tartrate (LOPRESSOR) 25 MG tablet Take 1/2 tablet by mouth twice daily for fast heart rates    ? mometasone (NASONEX) 50 MCG/ACT nasal spray Place 1 spray into the nose 2 (two) times daily. Two sprays each in each nostril 17 g 5  ? omeprazole (PRILOSEC) 20 MG capsule Take 1 capsule (20 mg total) by mouth daily. 90 capsule 2  ? SYMBICORT 160-4.5 MCG/ACT inhaler INHALE 2 PUFFS TWICE DAILY TO PREVENT COUGHING OR WHEEZING. RINSE, GARGLE, AND SPIT AFTER USE. 30.6 g 1  ? zafirlukast (ACCOLATE) 20 MG tablet Take 1 tablet (20 mg total) by mouth 2 (two) times daily. 180 tablet  2  ? ?No current facility-administered medications for this visit.  ? ?REVIEW OF SYSTEMS: Valu.Nieves ] denotes positive finding; [  ] denotes negative finding  ?CARDIOVASCULAR:  [ ]  chest pain   [ ]  dyspnea on exertion  [ ]  leg swelling  ?CONSTITUTIONAL:  [ ]  fever   [ ]  chills ? ?PHYSICAL EXAM: ?Vitals:  ? 04/20/21 1029  ?BP: 134/86  ?Pulse: (!) 52  ?Resp: 18  ?Temp: 98.2 ?F (36.8 ?C)  ?TempSrc: Temporal  ?SpO2: 97%  ?Weight: 186 lb (84.4 kg)  ?Height: 5' 9.5" (1.765 m)  ? ?GENERAL: The patient is a well-nourished male, in no acute distress. The vital signs are documented above. ?CARDIOVASCULAR: There is a regular rate and rhythm. ?PULMONARY: There is good air exchange bilaterally without wheezing or rales. ?VASCULAR: He has no significant bruising in the right leg.  Most of his Steri-Strips are still intact. ? ?DATA: ? ?VENOUS DUPLEX: I have independently interpreted his venous duplex scan today.  There is no evidence of DVT in the right lower extremity.  The right great saphenous vein is successfully closed from 1.84 cm distal to the saphenofemoral junction to the distal thigh. ? ?MEDICAL ISSUES: ? ?S/P LASER ABLATION RIGHT GREAT SAPHENOUS VEIN AND GREATER THAN 20 STABS: The patient is doing well status post laser  ablation of the right great saphenous vein and greater than 20 stabs.  He will continue to exercise and elevate his legs daily.  He plans a trip to [place] in June and we discussed the importance of wearing compression stockings on the flight.  He is on Eliquis.  I encouraged him to stay as active as possible.  If he begins having symptoms in the left leg he knows to give [place] a call and we can further evaluate his venous disease on the left.  Otherwise, I will see him as needed. ? ?Deitra Mayo ?[facility] ?[phone] ? ? ?

## 2021-04-23 ENCOUNTER — Other Ambulatory Visit: Payer: Self-pay | Admitting: Allergy and Immunology

## 2021-04-25 ENCOUNTER — Other Ambulatory Visit: Payer: Self-pay | Admitting: Allergy and Immunology

## 2021-05-23 ENCOUNTER — Other Ambulatory Visit: Payer: Self-pay | Admitting: Allergy and Immunology

## 2021-06-02 ENCOUNTER — Ambulatory Visit (INDEPENDENT_AMBULATORY_CARE_PROVIDER_SITE_OTHER): Payer: Medicare Other | Admitting: Allergy and Immunology

## 2021-06-02 ENCOUNTER — Encounter: Payer: Self-pay | Admitting: Allergy and Immunology

## 2021-06-02 DIAGNOSIS — K219 Gastro-esophageal reflux disease without esophagitis: Secondary | ICD-10-CM

## 2021-06-02 DIAGNOSIS — J3089 Other allergic rhinitis: Secondary | ICD-10-CM

## 2021-06-02 DIAGNOSIS — J4540 Moderate persistent asthma, uncomplicated: Secondary | ICD-10-CM | POA: Diagnosis not present

## 2021-06-02 MED ORDER — AZELASTINE HCL 0.1 % NA SOLN: 2.0000 | Freq: Two times a day (BID) | NASAL | 1 refills | Status: DC

## 2021-06-02 MED ORDER — SYMBICORT 160-4.5 MCG/ACT IN AERO: INHALATION_SPRAY | RESPIRATORY_TRACT | 3 refills | Status: DC

## 2021-06-02 MED ORDER — ZAFIRLUKAST 20 MG PO TABS: 20.0000 mg | ORAL_TABLET | Freq: Two times a day (BID) | ORAL | 2 refills | Status: DC

## 2021-06-02 MED ORDER — ALBUTEROL SULFATE HFA 108 (90 BASE) MCG/ACT IN AERS: INHALATION_SPRAY | RESPIRATORY_TRACT | 1 refills | Status: DC

## 2021-06-02 MED ORDER — OMEPRAZOLE 20 MG PO CPDR: 20.0000 mg | DELAYED_RELEASE_CAPSULE | Freq: Every day | ORAL | 2 refills | Status: DC

## 2021-06-02 MED ORDER — MOMETASONE FUROATE 50 MCG/ACT NA SUSP: 1.0000 | Freq: Two times a day (BID) | NASAL | 2 refills | Status: DC

## 2021-06-02 NOTE — Progress Notes (Signed)
? ?[place] - [employer] - [place] - [place] - [place] ? ? ?Follow-up Note ? ?Referring Provider: Hal Morales, NP ?Primary Provider: Hal Morales, NP ?Date of Office Visit: 06/02/2021 ? ?Subjective:  ? ?Ross Robinson. (DOB: 1950/03/25) is a 71 y.o. male who [facility] in re-evaluation of the following: ? ?HPI: Oddie returns to this clinic in evaluation of asthma, allergic rhinoconjunctivitis, and LPR.  His last visit to this clinic was 02 June 2020. ? ?He has really done very well regarding his respiratory tract and has only had 1 flareup with wheezing and coughing [place] the [place] for which he took an antibiotic in November 2022.  Otherwise, he has not required a systemic steroid or antibiotic and he can exert himself without any problem and he rarely uses short acting bronchodilator while continuing to use Symbicort and Accolate on a consistent basis. ? ?He had very little problems with his nose while using a nasal steroid. ? ?His reflux is under excellent control while using omeprazole. ? ?He has worked through his issue with atrial fibrillation treated with a electro ablation and at this point stays on low-dose metoprolol and Eliquis. ? ?Allergies as of 06/02/2021   ? ?   Reactions  ? Codeine Nausea And Vomiting  ? Biaxin [clarithromycin] Rash  ? Ceftin [cefuroxime Axetil] Rash  ? ?  ? ?  ?Medication List  ? ? ?acetaminophen 500 MG tablet ?Commonly known as: TYLENOL ?Take 1,000 mg by mouth every 6 (six) hours as needed for moderate pain. ?  ?albuterol 108 (90 Base) MCG/ACT inhaler ?Commonly known as: VENTOLIN HFA ?inhale 2 puffs by mouth every 4 to 6 hours as needed ?  ?azelastine 0.1 % nasal spray ?Commonly known as: ASTELIN ?Place 2 sprays into both nostrils 2 (two) times daily. Use in each nostril as directed ?  ?diazepam 10 MG tablet ?Commonly known as: VALIUM ?Take 5 mg by mouth every 6 (six) hours as needed for anxiety. ?  ?Eliquis 5 MG  Tabs tablet ?Generic drug: apixaban ?Take 1 tablet (5 mg total) by mouth 2 (two) times daily. ?  ?hydrocortisone 2.5 % rectal cream ?Commonly known as: ANUSOL-HC ?Place 1 application rectally daily. ?  ?metoprolol tartrate 25 MG tablet ?Commonly known as: LOPRESSOR ?Take 1/2 tablet by mouth twice daily for fast heart rates ?  ?mometasone 50 MCG/ACT nasal spray ?Commonly known as: Nasonex ?Place 1 spray into the nose 2 (two) times daily. Two sprays each in each nostril ?  ?omeprazole 20 MG capsule ?Commonly known as: PRILOSEC ?Take 1 capsule (20 mg total) by mouth daily. ?  ?Symbicort 160-4.5 MCG/ACT inhaler ?Generic drug: budesonide-formoterol ?INHALE 2 PUFFS TWICE DAILY TO PREVENT COUGHING OR WHEEZING. RINSE, GARGLE, AND SPIT AFTER USE. ?  ?zafirlukast 20 MG tablet ?Commonly known as: ACCOLATE ?Take 1 tablet (20 mg total) by mouth 2 (two) times daily. ?  ? ?Past Medical History:  ?Diagnosis Date  ? Allergic rhinitis   ? Asthma   ? LPRD (laryngopharyngeal reflux disease)   ? Moderate persistent asthma without complication 04/21/2019  ? Other allergic rhinitis 04/21/2019  ? Persistent atrial fibrillation (HCC)   ? ? ?Past Surgical History:  ?Procedure Laterality Date  ? ADENOIDECTOMY    ? ATRIAL FIBRILLATION ABLATION N/A 08/12/2019  ? Procedure: ATRIAL FIBRILLATION ABLATION;  Surgeon: Hillis Range, MD;  [facility];  Service: Cardiovascular;  Laterality: N/A;  ? COLON RESECTION    ?  ENDOVENOUS ABLATION SAPHENOUS VEIN W/ LASER Right 04/06/2021  ? endovenous laser ablation right greater saphenous vein and stab phlebectomy > 20 incisions right leg by Cari Caraway MD  ? HERNIA REPAIR    ? SINOSCOPY    ? TONSILLECTOMY    ? ? ?Review of systems negative except as noted in HPI / PMHx or noted below: ? ?Review of Systems  ?Constitutional: Negative.   ?HENT: Negative.    ?Eyes: Negative.   ?Respiratory: Negative.    ?Cardiovascular: Negative.   ?Gastrointestinal: Negative.   ?Genitourinary: Negative.    ?Musculoskeletal: Negative.   ?Skin: Negative.   ?Neurological: Negative.   ?Endo/Heme/Allergies: Negative.   ?Psychiatric/Behavioral: Negative.    ? ? ?Objective:  ? ?Vitals:  ? 06/02/21 1517  ?BP: 118/72  ?Pulse: 62  ?Resp: 16  ?SpO2: 95%  ? ?Height: 5\' 10"  (177.8 cm)  ?Weight: 188 lb (85.3 kg)  ? ?Physical Exam ?Constitutional:   ?   Appearance: He is not diaphoretic.  ?HENT:  ?   Head: Normocephalic.  ?   Right Ear: Tympanic membrane, ear canal and external ear normal.  ?   Left Ear: Tympanic membrane, ear canal and external ear normal.  ?   Nose: Nose normal. No mucosal edema or rhinorrhea.  ?   Mouth/Throat:  ?   Pharynx: Uvula midline. No oropharyngeal exudate.  ?Eyes:  ?   Conjunctiva/sclera: Conjunctivae normal.  ?Neck:  ?   Thyroid: No thyromegaly.  ?   Trachea: Trachea normal. No tracheal tenderness or tracheal deviation.  ?Cardiovascular:  ?   Rate and Rhythm: Normal rate and regular rhythm.  ?   Heart sounds: Normal heart sounds, S1 normal and S2 normal. No murmur heard. ?Pulmonary:  ?   Effort: No respiratory distress.  ?   Breath sounds: Normal breath sounds. No stridor. No wheezing or rales.  ?Lymphadenopathy:  ?   Head:  ?   Right side of head: No tonsillar adenopathy.  ?   Left side of head: No tonsillar adenopathy.  ?   Cervical: No cervical adenopathy.  ?Skin: ?   Findings: No erythema or rash.  ?   Nails: There is no clubbing.  ?Neurological:  ?   Mental Status: He is alert.  ? ? ?Diagnostics:  ?  ?Spirometry was performed and demonstrated an FEV1 of 2.37 at 73 % of predicted.   ? ?Assessment and Plan:  ? ?1. Asthma, moderate persistent, well-controlled   ?2. Other allergic rhinitis   ?3. LPRD (laryngopharyngeal reflux disease)   ? ? ?1. Continue Symbicort 160 2 inhalations twice a day  ? ?2. Continue Accolate 20 mg twice a day ? ?3. Continue Nasonex one spray each nostril 1-2 times a day ? ?4. Continue omeprazole 20 mg daily ? ?5. If needed:  ? ? C. Astelin  ? B. Ventolin HFA ? C. Mucinex  DM ? ?7. Return to clinic in 12 months or earlier if problem ? ? is really doing very well and he has a very good understanding of his disease state and how his medications work and appropriate dosing of his medications.  At this point we are going to continue him on a plan of anti-inflammatory agents for his airway and therapy directed against reflux and see him back in this clinic in 1 year or earlier if there is a problem.    ? ?[name], MD ?Allergy / Immunology ?[facility] ?

## 2021-06-02 NOTE — Patient Instructions (Addendum)
?  1. Continue Symbicort 160 2 inhalations twice a day  ? ?2. Continue Accolate 20 mg twice a day ? ?3. Continue Nasonex one spray each nostril 1-2 times a day ? ?4. Continue omeprazole 20 mg daily ? ?5. If needed:  ? ? C. Astelin  ? B. Ventolin HFA ? C. Mucinex DM ? ?7. Return to clinic in 12 months or earlier if problem ? ? ? ?    ?

## 2021-06-06 ENCOUNTER — Encounter: Payer: Self-pay | Admitting: Allergy and Immunology

## 2021-07-04 ENCOUNTER — Other Ambulatory Visit: Payer: Self-pay | Admitting: Cardiology

## 2021-07-05 NOTE — Telephone Encounter (Addendum)
Prescription refill request for Eliquis received. ?Indication: Afib  ?Last office visit: 01/27/21 Noralyn Pick)  ?Scr: 0.93 (12/08/20 via PCP)  ?Age: 71 ?Weight: 85.3kg ? ?Appropriate dose and refill sent to requested pharmacy.  ?

## 2021-11-12 ENCOUNTER — Other Ambulatory Visit: Payer: Self-pay | Admitting: Nurse Practitioner

## 2021-11-14 ENCOUNTER — Other Ambulatory Visit (HOSPITAL_COMMUNITY): Payer: Self-pay | Admitting: *Deleted

## 2021-11-14 MED ORDER — METOPROLOL TARTRATE 25 MG PO TABS: 25.0000 mg | ORAL_TABLET | Freq: Two times a day (BID) | ORAL | 1 refills | Status: DC

## 2021-11-22 ENCOUNTER — Ambulatory Visit (HOSPITAL_COMMUNITY)
Admission: RE | Admit: 2021-11-22 | Discharge: 2021-11-22 | Disposition: A | Discharge status: Home / Self Care | Payer: Medicare Other | Source: Ambulatory Visit | Attending: Nurse Practitioner | Admitting: Nurse Practitioner

## 2021-11-22 ENCOUNTER — Encounter (HOSPITAL_COMMUNITY): Payer: Self-pay | Admitting: Physician Assistant

## 2021-11-22 VITALS — BP 118/82 | HR 56 | Ht 70.0 in | Wt 185.2 lb

## 2021-11-22 DIAGNOSIS — I4819 Other persistent atrial fibrillation: Secondary | ICD-10-CM | POA: Diagnosis present

## 2021-11-22 DIAGNOSIS — D6869 Other thrombophilia: Secondary | ICD-10-CM

## 2021-11-22 HISTORY — DX: Other persistent atrial fibrillation: D68.69

## 2021-11-22 HISTORY — DX: Other persistent atrial fibrillation: I48.19

## 2021-11-22 NOTE — Progress Notes (Signed)
Primary Care Physician: Ross Court, NP Referring Physician:Dr. Jimmie Molly Colt Martelle. is a 71 y.o. male with a h/o persistent afib s/p ablation 07/2019 who presents for follow up in the [facility]. He is on Eliquis 5 mg bid for CHA2DS2VASc score of 2. On follow up today, patient reports that he has done well from a cardiac standpoint. He has not had any afib noted on his K[employer]ia mobile. He did have COVID in July and has been slowly recovering. No bleeding issues on anticoagulation.   Today, he denies symptoms of palpitations, chest pain, shortness of breath, orthopnea, PND, lower extremity edema, dizziness, presyncope, syncope, or neurologic sequela. The patient is tolerating medications without difficulties and is otherwise without complaint today.   Past Medical History:  Diagnosis Date   Allergic rhinitis    Asthma    LPRD (laryngopharyngeal reflux disease)    Moderate persistent asthma without complication 03/30/3660   Other allergic rhinitis 04/21/2019   Persistent atrial fibrillation (HCC)    Past Surgical History:  Procedure Laterality Date   ADENOIDECTOMY     ATRIAL FIBRILLATION ABLATION N/A 08/12/2019   Procedure: ATRIAL FIBRILLATION ABLATION;  Surgeon: Thompson Grayer, MD;  Location: [facility];  Service: Cardiovascular;  Laterality: N/A;   COLON RESECTION     ENDOVENOUS ABLATION SAPHENOUS VEIN W/ LASER Right 04/06/2021   endovenous laser ablation right greater saphenous vein and stab phlebectomy > 20 incisions right leg by Gae Gallop MD   HERNIA REPAIR     SINOSCOPY     TONSILLECTOMY      Current Outpatient Medications  Medication Sig Dispense Refill   acetaminophen (TYLENOL) 500 MG tablet Take 1,000 mg by mouth every 6 (six) hours as needed for moderate pain.     albuterol (VENTOLIN HFA) 108 (90 Base) MCG/ACT inhaler inhale 2 puffs by mouth every 4 to 6 hours as needed 18 g 1   azelastine (ASTELIN) 0.1 % nasal spray Place 2  sprays into both nostrils 2 (two) times daily. 90 mL 1   diazepam (VALIUM) 10 MG tablet Take 5 mg by mouth every 6 (six) hours as needed for anxiety.     ELIQUIS 5 MG TABS tablet TAKE 1 TABLET TWICE A DAY 180 tablet 1   hydrocortisone (ANUSOL-HC) 2.5 % rectal cream Place 1 application rectally daily.     metoprolol tartrate (LOPRESSOR) 25 MG tablet Take 1 tablet (25 mg total) by mouth 2 (two) times daily. 180 tablet 1   mometasone (NASONEX) 50 MCG/ACT nasal spray Place 1 spray into the nose 2 (two) times daily. Two sprays each in each nostril 51 g 2   omeprazole (PRILOSEC) 20 MG capsule Take 1 capsule (20 mg total) by mouth daily. 90 capsule 2   SYMBICORT 160-4.5 MCG/ACT inhaler INHALE 2 PUFFS TWICE DAILY TO PREVENT COUGHING OR WHEEZING. RINSE, GARGLE, AND SPIT AFTER USE. 30.6 each 3   zafirlukast (ACCOLATE) 20 MG tablet Take 1 tablet (20 mg total) by mouth 2 (two) times daily. 180 tablet 2   No current facility-administered medications for this encounter.    Allergies  Allergen Reactions   Codeine Nausea And Vomiting   Biaxin [Clarithromycin] Rash   Ceftin [Cefuroxime Axetil] Rash    Social History   Socioeconomic History   Marital status: Married    Spouse name: Not on file   Number of children: Not on file   Years of education: Not on file   Highest  education level: Not on file  Occupational History   Not on file  Tobacco Use   Smoking status: Former    Types: Cigars   Smokeless tobacco: Never   Tobacco comments:    Former smoker 1 cigar a week or month 11/22/21  Vaping Use   Vaping Use: Never used  Substance and Sexual Activity   Alcohol use: Yes    Alcohol/week: 3.0 - 4.0 standard drinks of alcohol    Types: 3 - 4 Standard drinks or equivalent per week    Comment: 3-4 drinks some weeks 11/22/21   Drug use: No   Sexual activity: Not Currently  Other Topics Concern   Not on file  Social History Narrative   Lives in [place] with wife.   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[employer], [employer] quality- land use and zoning   Social Determinants of Health   Financial Resource Strain: Not on file  Food Insecurity: Not on file  Transportation Needs: Not on file  Physical Activity: Not on file  Stress: Not on file  Social Connections: Not on file  Intimate Partner Violence: Not on file    Family History  Adopted: Yes  Problem Relation Age of Onset   Allergic rhinitis Neg Hx    Angioedema Neg Hx    Asthma Neg Hx    Atopy Neg Hx    Eczema Neg Hx    Immunodeficiency Neg Hx    Urticaria Neg Hx     ROS- All systems are reviewed and negative except as per the HPI above  Physical Exam: Vitals:   11/22/21 1420  BP: 118/82  Pulse: (!) 56  Weight: 84 kg  Height: 5\' 10"  (1.778 m)    Wt Readings from Last 3 Encounters:  11/22/21 84 kg  06/02/21 85.3 kg  04/20/21 84.4 kg    Labs: Lab Results  Component Value Date   NA 145 (H) 07/29/2019   K 4.1 07/29/2019   CL 107 (H) 07/29/2019   CO2 23 07/29/2019   GLUCOSE 83 07/29/2019   BUN 24 07/29/2019   CREATININE 1.02 07/29/2019   CALCIUM 8.9 07/29/2019   No results found for: "INR" No results found for: "CHOL", "HDL", "LDLCALC", "TRIG"   GEN- The patient is a well appearing male, alert and oriented x 3 today.   HEENT-head normocephalic, atraumatic, sclera clear, conjunctiva pink, hearing intact, trachea midline. Lungs- Clear to ausculation bilaterally, normal work of breathing Heart- Regular rate and rhythm, no murmurs, rubs or gallops  GI- soft, NT, ND, + BS Extremities- no clubbing, cyanosis, or edema MS- no significant deformity or atrophy Skin- no rash or lesion Psych- euthymic mood, full affect Neuro- strength and sensation are intact   EKG today demonstrates SB, RBBB Vent. rate 56 BPM PR interval 118 ms QRS duration 146 ms QT/QTcB 470/453 ms   Assessment and Plan: 1. Persistent afib  S/p afib ablation 08/12/19 Patient appears to be maintaining  SR. Continue Lopressor 25 mg BID  Continue Eliquis 5 mg BID Home monitoring with Kardia mobile  2. CHA2DS2VASc score of 2 Continue eliquis 5 mg bid without interruption   Follow up in the AF clinic in one year.    08/14/19 PA-C [facility] [address] [place], [place] [phone]

## 2022-01-01 ENCOUNTER — Other Ambulatory Visit: Payer: Self-pay | Admitting: Internal Medicine

## 2022-01-02 NOTE — Telephone Encounter (Signed)
Prescription refill request for Eliquis received. Indication:afib Last office visit:10/23 ZNB:VAPOL labs Age: 71 Weight:84 kg  Prescription refilled

## 2022-03-10 ENCOUNTER — Other Ambulatory Visit (HOSPITAL_COMMUNITY): Payer: Self-pay | Admitting: Nurse Practitioner

## 2022-03-30 ENCOUNTER — Encounter (HOSPITAL_COMMUNITY): Payer: Self-pay | Admitting: *Deleted

## 2022-03-31 ENCOUNTER — Other Ambulatory Visit: Payer: Self-pay | Admitting: Allergy and Immunology

## 2022-04-12 ENCOUNTER — Other Ambulatory Visit: Payer: Self-pay | Admitting: Allergy and Immunology

## 2022-04-19 ENCOUNTER — Other Ambulatory Visit: Payer: Self-pay | Admitting: *Deleted

## 2022-04-19 MED ORDER — BUDESONIDE-FORMOTEROL FUMARATE 160-4.5 MCG/ACT IN AERO: INHALATION_SPRAY | RESPIRATORY_TRACT | 0 refills | Status: DC

## 2022-06-04 ENCOUNTER — Other Ambulatory Visit: Payer: Self-pay | Admitting: Allergy and Immunology

## 2022-06-10 ENCOUNTER — Other Ambulatory Visit: Payer: Self-pay | Admitting: Allergy and Immunology

## 2022-06-21 ENCOUNTER — Encounter: Payer: Self-pay | Admitting: Allergy and Immunology

## 2022-06-21 ENCOUNTER — Ambulatory Visit (INDEPENDENT_AMBULATORY_CARE_PROVIDER_SITE_OTHER): Payer: Medicare Other | Admitting: Allergy and Immunology

## 2022-06-21 VITALS — BP 128/78 | HR 60 | Resp 16

## 2022-06-21 DIAGNOSIS — J3089 Other allergic rhinitis: Secondary | ICD-10-CM | POA: Diagnosis not present

## 2022-06-21 DIAGNOSIS — J4540 Moderate persistent asthma, uncomplicated: Secondary | ICD-10-CM | POA: Diagnosis not present

## 2022-06-21 DIAGNOSIS — K219 Gastro-esophageal reflux disease without esophagitis: Secondary | ICD-10-CM

## 2022-06-21 MED ORDER — MOMETASONE FUROATE 50 MCG/ACT NA SUSP: NASAL | 3 refills | Status: DC

## 2022-06-21 MED ORDER — BREZTRI AEROSPHERE 160-9-4.8 MCG/ACT IN AERO: INHALATION_SPRAY | RESPIRATORY_TRACT | 5 refills | Status: DC

## 2022-06-21 MED ORDER — ZAFIRLUKAST 20 MG PO TABS: ORAL_TABLET | ORAL | 3 refills | Status: DC

## 2022-06-21 MED ORDER — OMEPRAZOLE 20 MG PO CPDR: DELAYED_RELEASE_CAPSULE | ORAL | 3 refills | Status: DC

## 2022-06-21 NOTE — Patient Instructions (Addendum)
  1. START BREZTRI - 2 inhalations twice a day (does this help???)  2. Continue Accolate 20 mg twice a day  3. Continue Nasonex one spray each nostril 1-2 times a day  4. Continue omeprazole 20 mg daily  5. If needed:    C. Astelin   B. Albuterol HFA  C. Mucinex DM  6. Return to clinic in 12 months or earlier if problem

## 2022-06-21 NOTE — Progress Notes (Unsigned)
[place] [facility] [place] - [place] [facility] [place] [facility] [place]   Follow-up Note  Referring Provider: Hal Morales, NP Primary Provider: Jerrye Bushy, FNP Date of Office Visit: 06/21/2022  Subjective:   Ross Robinson. (DOB: December 21, 1950) is a 72 y.o. male who [facility] 06[facility]/2022 in re-evaluation of the following:  HPI: Ross Robinson returns to this clinic in evaluation of asthma, allergic rhinoconjunctivitis, LPR.  I last saw him in this clinic 02 June 2021.  Overall he has done very well since his last visit without a requirement for systemic steroid or an antibiotic and rare use of a short acting bronchodilator.  But, he does have intermittent cough throughout the year and it waxes and wanes in intensity.  Does not really disturb his sleep.  It is not really exercise-induced or cold air induced.  He does not have any other associated systemic symptoms or respiratory tract symptoms with this cough.  His upper airway disease is under good control while using his Accolate and Nasonex.  His reflux is under very good control while using his omeprazole.  Allergies as of 06/21/2022       Reactions   Codeine Nausea And Vomiting   Biaxin [clarithromycin] Rash   Ceftin [cefuroxime Axetil] Rash        Medication List    acetaminophen 500 MG tablet Commonly known as: TYLENOL Take 1,000 mg by mouth every 6 (six) hours as needed for moderate pain.   albuterol 108 (90 Base) MCG/ACT inhaler Commonly known as: VENTOLIN HFA inhale 2 puffs by mouth every 4 to 6 hours as needed   azelastine 0.1 % nasal spray Commonly known as: ASTELIN Place 2 sprays into both nostrils 2 (two) times daily.   budesonide-formoterol 160-4.5 MCG/ACT inhaler Commonly known as: Symbicort INHALE 2 PUFFS TWICE DAILY TO PREVENT COUGHING OR WHEEZING. RINSE, GARGLE, AND SPIT AFTER USE.   diazepam 10 MG tablet Commonly known as: VALIUM Take 5 mg by mouth every 6 (six) hours as needed  for anxiety.   Eliquis 5 MG Tabs tablet Generic drug: apixaban TAKE 1 TABLET BY MOUTH TWICE A DAY   hydrocortisone 2.5 % rectal cream Commonly known as: ANUSOL-HC Place 1 application rectally daily.   metoprolol tartrate 25 MG tablet Commonly known as: LOPRESSOR TAKE 1 TABLET BY MOUTH TWICE A DAY   mometasone 50 MCG/ACT nasal spray Commonly known as: NASONEX PLACE 1 SPRAY INTO THE NOSE 2 (TWO) TIMES DAILY. TWO SPRAYS EACH IN EACH NOSTRIL   omeprazole 20 MG capsule Commonly known as: PRILOSEC TAKE 1 CAPSULE BY MOUTH EVERY DAY   vitamin D3 50 MCG (2000 UT) Caps Take by mouth daily.   zafirlukast 20 MG tablet Commonly known as: ACCOLATE TAKE 1 TABLET BY MOUTH TWICE A DAY    Past Medical History:  Diagnosis Date   Allergic rhinitis    Asthma    LPRD (laryngopharyngeal reflux disease)    Moderate persistent asthma without complication 04/21/2019   Other allergic rhinitis 04/21/2019   Persistent atrial fibrillation (HCC)     Past Surgical History:  Procedure Laterality Date   ADENOIDECTOMY     ATRIAL FIBRILLATION ABLATION N/A 08/12/2019   Procedure: ATRIAL FIBRILLATION ABLATION;  Surgeon: Hillis Range, MD;  [facility]: [facility];  Service: Cardiovascular;  Laterality: N/A;   COLON RESECTION     ENDOVENOUS ABLATION SAPHENOUS VEIN W/ LASER Right 04/06/2021   endovenous laser ablation right greater saphenous vein and stab phlebectomy >  20 incisions right leg by Cari Caraway MD   HERNIA REPAIR     SINOSCOPY     TONSILLECTOMY      Review of systems negative except as noted in HPI / PMHx or noted below:  Review of Systems  Constitutional: Negative.   HENT: Negative.    Eyes: Negative.   Respiratory: Negative.    Cardiovascular: Negative.   Gastrointestinal: Negative.   Genitourinary: Negative.   Musculoskeletal: Negative.   Skin: Negative.   Neurological: Negative.   Endo/Heme/Allergies: Negative.   Psychiatric/Behavioral: Negative.       Objective:    Vitals:   06/21/22 1128  BP: 128/78  Pulse: 60  Resp: 16  SpO2: 96%          Physical Exam Constitutional:      Appearance: He is not diaphoretic.  HENT:     Head: Normocephalic.     Right Ear: Tympanic membrane, ear canal and external ear normal.     Left Ear: Tympanic membrane, ear canal and external ear normal.     Nose: Nose normal. No mucosal edema or rhinorrhea.     Mouth/Throat:     Pharynx: Uvula midline. No oropharyngeal exudate.  Eyes:     Conjunctiva/sclera: Conjunctivae normal.  Neck:     Thyroid: No thyromegaly.     Trachea: Trachea normal. No tracheal tenderness or tracheal deviation.  Cardiovascular:     Rate and Rhythm: Normal rate and regular rhythm.     Heart sounds: Normal heart sounds, S1 normal and S2 normal. No murmur heard. Pulmonary:     Effort: No respiratory distress.     Breath sounds: Normal breath sounds. No stridor. No wheezing or rales.  Lymphadenopathy:     Head:     Right side of head: No tonsillar adenopathy.     Left side of head: No tonsillar adenopathy.     Cervical: No cervical adenopathy.  Skin:    Findings: No erythema or rash.     Nails: There is no clubbing.  Neurological:     Mental Status: He is alert.     Diagnostics: Spirometry was performed and demonstrated an FEV1 of 2.27 at 71 % of predicted.  Assessment and Plan:   1. Asthma, moderate persistent, well-controlled   2. Other allergic rhinitis   3. LPRD (laryngopharyngeal reflux disease)    1. START BREZTRI - 2 inhalations twice a day (does this help???)  2. Continue Accolate 20 mg twice a day  3. Continue Nasonex one spray each nostril 1-2 times a day  4. Continue omeprazole 20 mg daily  5. If needed:    C. Astelin   B. Albuterol HFA  C. Mucinex DM  6. Return to clinic in 12 months or earlier if problem  Ross Robinson appears to have a little bit more activity of his asthma and we will start him on a triple inhaler and we will see what happens as a  result of this medical manipulation.  Of asked him to contact me to let me know if this helps.  If not, we may consider giving him a biologic agent.  His reflux is under good control as is upper airway disease.  Will see how things go over the course of the next several months.     Laurette Schimke, MD Allergy / Immunology [facility]

## 2022-06-22 ENCOUNTER — Encounter: Payer: Self-pay | Admitting: Allergy and Immunology

## 2022-07-18 ENCOUNTER — Other Ambulatory Visit: Payer: Self-pay | Admitting: *Deleted

## 2022-07-18 MED ORDER — APIXABAN 5 MG PO TABS: 5.0000 mg | ORAL_TABLET | Freq: Two times a day (BID) | ORAL | 1 refills | Status: DC

## 2022-07-18 NOTE — Telephone Encounter (Signed)
Prescription refill request for Eliquis received. Indication: AF Last office visit: 11/22/21  C Fenton PA Scr: 0.9 on 06/12/22  [facility] Age: 72 Weight: 84kg  Based on above findings Eliquis 5mg  twice daily is the appropriate dose.  Refill approved.

## 2022-07-20 ENCOUNTER — Other Ambulatory Visit: Payer: Self-pay | Admitting: Allergy and Immunology

## 2022-11-23 ENCOUNTER — Ambulatory Visit (HOSPITAL_COMMUNITY)
Admission: RE | Admit: 2022-11-23 | Discharge: 2022-11-23 | Disposition: A | Discharge status: Home / Self Care | Payer: Medicare Other | Source: Ambulatory Visit | Attending: Physician Assistant | Admitting: Physician Assistant

## 2022-11-23 ENCOUNTER — Encounter (HOSPITAL_COMMUNITY): Payer: Self-pay | Admitting: Physician Assistant

## 2022-11-23 VITALS — BP 138/80 | HR 52 | Ht 70.0 in | Wt 188.4 lb

## 2022-11-23 DIAGNOSIS — D6869 Other thrombophilia: Secondary | ICD-10-CM | POA: Diagnosis not present

## 2022-11-23 DIAGNOSIS — Z7901 Long term (current) use of anticoagulants: Secondary | ICD-10-CM | POA: Insufficient documentation

## 2022-11-23 DIAGNOSIS — I4819 Other persistent atrial fibrillation: Secondary | ICD-10-CM

## 2022-11-23 DIAGNOSIS — R9431 Abnormal electrocardiogram [ECG] [EKG]: Secondary | ICD-10-CM | POA: Diagnosis not present

## 2022-11-23 DIAGNOSIS — I2510 Atherosclerotic heart disease of native coronary artery without angina pectoris: Secondary | ICD-10-CM | POA: Insufficient documentation

## 2022-11-23 LAB — BASIC METABOLIC PANEL
Anion gap: 7 (ref 5–15)
BUN: 22 mg/dL (ref 8–23)
CO2: 21 mmol/L — ABNORMAL LOW (ref 22–32)
Calcium: 8.5 mg/dL — ABNORMAL LOW (ref 8.9–10.3)
Chloride: 110 mmol/L (ref 98–111)
Creatinine, Ser: 0.94 mg/dL (ref 0.61–1.24)
GFR, Estimated: >60 mL/min (ref >60)
Glucose, Bld: 105 mg/dL — ABNORMAL HIGH (ref 70–99)
Potassium: 3.8 mmol/L (ref 3.5–5.1)
Sodium: 138 mmol/L (ref 135–145)

## 2022-11-23 LAB — CBC
HCT: 44.5 % (ref 39.0–52.0)
Hemoglobin: 14.9 g/dL (ref 13.0–17.0)
MCH: 33.8 pg (ref 26.0–34.0)
MCHC: 33.5 g/dL (ref 30.0–36.0)
MCV: 100.9 fL — ABNORMAL HIGH (ref 80.0–100.0)
Platelets: 188 10*3/uL (ref 150–400)
RBC: 4.41 MIL/uL (ref 4.22–5.81)
RDW: 12.5 % (ref 11.5–15.5)
WBC: 5.9 10*3/uL (ref 4.0–10.5)
nRBC: 0 % (ref 0.0–0.2)

## 2022-11-23 MED ORDER — METOPROLOL TARTRATE 25 MG PO TABS: 12.5000 mg | ORAL_TABLET | Freq: Two times a day (BID) | ORAL | 3 refills | Status: DC

## 2022-11-23 NOTE — Progress Notes (Signed)
Primary Care Physician: Jerrye Bushy, FNP Referring Physician: Dr. Daiva Eves. is a 72 y.o. male with a h/o CAD and persistent afib s/p ablation 07/2019 who presents for follow up in the [facility]. He is on Eliquis 5 mg bid for CHA2DS2VASc score of 2.   On follow up today, patient has done well from an afib standpoint. He has had 3-4 episodes of lightheadedness over the past 3 months. The first episode occurred when going from a lying to standpoint position and he described it as both himself and the room were spinning all directions. The other episodes were not as severe. He checked his [employer] mobile during the episodes and he was in SR.   Today, he denies symptoms of palpitations, chest pain, shortness of breath, orthopnea, PND, lower extremity edema, presyncope, syncope, or neurologic sequela. The patient is tolerating medications without difficulties and is otherwise without complaint today.   Past Medical History:  Diagnosis Date   Allergic rhinitis    Asthma    LPRD (laryngopharyngeal reflux disease)    Moderate persistent asthma without complication 04/21/2019   Other allergic rhinitis 04/21/2019   Persistent atrial fibrillation (HCC)     Current Outpatient Medications  Medication Sig Dispense Refill   acetaminophen (TYLENOL) 500 MG tablet Take 1,000 mg by mouth every 6 (six) hours as needed for moderate pain.     albuterol (VENTOLIN HFA) 108 (90 Base) MCG/ACT inhaler inhale 2 puffs by mouth every 4 to 6 hours as needed 18 g 1   apixaban (ELIQUIS) 5 MG TABS tablet Take 1 tablet (5 mg total) by mouth 2 (two) times daily. 180 tablet 1   Azelastine HCl 137 MCG/SPRAY SOLN PLACE 2 SPRAYS INTO BOTH NOSTRILS 2 (TWO) TIMES DAILY 90 mL 1   BREZTRI AEROSPHERE 160-9-4.8 MCG/ACT AERO Inhale two puffs twice daily to prevent cough or wheeze. Rinse mouth after use. 32.1 g 5   Cholecalciferol (VITAMIN D3) 50 MCG (2000 UT) CAPS Take by mouth daily.      diazepam (VALIUM) 10 MG tablet Take 5 mg by mouth every 6 (six) hours as needed for anxiety.     hydrocortisone (ANUSOL-HC) 2.5 % rectal cream Place 1 application rectally daily.     metoprolol tartrate (LOPRESSOR) 25 MG tablet TAKE 1 TABLET BY MOUTH TWICE A DAY 180 tablet 3   mometasone (NASONEX) 50 MCG/ACT nasal spray Use one spray in each nostril 1-2 times daily 51 g 3   omeprazole (PRILOSEC) 20 MG capsule Take one capsule once daily 90 capsule 3   zafirlukast (ACCOLATE) 20 MG tablet Take one tablet twice daily 90 tablet 3   No current facility-administered medications for this encounter.    ROS- All systems are reviewed and negative except as per the HPI above  Physical Exam: Vitals:   11/23/22 1438  BP: 138/80  Pulse: (!) 52  Weight: 85.5 kg  Height: 5\' 10"  (1.778 m)     Wt Readings from Last 3 Encounters:  11/23/22 85.5 kg  11/22/21 84 kg  06/02/21 85.3 kg    GEN: Well nourished, well developed in no acute distress NECK: No JVD; No carotid bruits CARDIAC: Regular rate and rhythm, no murmurs, rubs, gallops RESPIRATORY:  Clear to auscultation without rales, wheezing or rhonchi  ABDOMEN: Soft, non-tender, non-distended EXTREMITIES:  No edema; No deformity    EKG today demonstrates SB, LVH, RBBB Vent. rate 52 BPM PR interval 138 ms QRS duration 134  ms QT/QTcB 464/431 ms   CHA2DS2-VASc Score = 2  The patient's score is based upon: CHF History: 0 HTN History: 0 Diabetes History: 0 Stroke History: 0 Vascular Disease History: 1 (CAD on CT) Age Score: 1 Gender Score: 0       ASSESSMENT AND PLAN: Persistent Atrial Fibrillation (ICD10:  I48.19) The patient's CHA2DS2-VASc score is 2, indicating a 2.2% annual risk of stroke.   S/p afib ablation 08/12/19 Patient appears to be maintaining SR He is bradycardic today, will decrease Lopressor to 12.5 mg BID to see if this improves his dizziness. ? If related to vertigo, he plans to discuss with his PCP. Continue  Eliquis 5 mg BID Home monitoring with [employer] mobile  Secondary Hypercoagulable State (ICD10:  [phone]) The patient is at significant risk for stroke/thromboembolism based upon his CHA2DS2-VASc Score of 2.  Continue Apixaban (Eliquis).   CAD CAC score 946 on CT 2021 No anginal symptoms   Follow up with Dr Dulce Sellar to reestablish care in [place]. AF clinic as needed.    Jorja Loa PA-C Afib Clinic [facility] [address] [place], [place] [address] [phone]

## 2022-11-23 NOTE — Patient Instructions (Signed)
Decrease metoprolol to 1/2 tablet twice a day  

## 2022-12-20 ENCOUNTER — Other Ambulatory Visit: Payer: Self-pay | Admitting: Allergy and Immunology

## 2023-01-01 ENCOUNTER — Other Ambulatory Visit: Payer: Self-pay | Admitting: Physician Assistant

## 2023-01-02 ENCOUNTER — Other Ambulatory Visit: Payer: Self-pay | Admitting: Allergy and Immunology

## 2023-01-02 MED ORDER — APIXABAN 5 MG PO TABS: 5.0000 mg | ORAL_TABLET | Freq: Two times a day (BID) | ORAL | 1 refills | Status: DC

## 2023-01-02 NOTE — Addendum Note (Signed)
Addended by: Shona Simpson on: 01/02/2023 10:52 AM   Modules accepted: Orders

## 2023-01-02 NOTE — Progress Notes (Unsigned)
Cardiology Office Note:    Date:  01/02/2023   ID:  Ross Peter., DOB [birth year], MRN [id number]  PCP:  Jerrye Bushy, FNP  Cardiologist:  Norman Herrlich, MD    Referring MD: Danice Goltz, PA    ASSESSMENT:    No diagnosis found. PLAN:    In order of problems listed above:  ***   Next appointment: ***   Medication Adjustments/Labs and Tests Ordered: Current medicines are reviewed at length with the patient today.  Concerns regarding medicines are outlined above.  No orders of the defined types were placed in this encounter.  No orders of the defined types were placed in this encounter.    History of Present Illness:    Ross Misak. is a 72 y.o. male with a hx of persistent atrial fibrillation with EP catheter ablation 08/12/2019 maintaining sinus rhythm chronic anticoagulation and a coronary artery calcium score of 946 on CT.  He was last seen in A-fib clinic 11/23/2022 and referred today to establish cardiology care.  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  Previous echocardiogram showed EF of 45 to 50%. Compliance with diet, lifestyle and medications: *** Past Medical History:  Diagnosis Date   Allergic rhinitis    Asthma    LPRD (laryngopharyngeal reflux disease)    Moderate persistent asthma without complication 04/21/2019   Other allergic rhinitis 04/21/2019   Persistent atrial fibrillation (HCC)     Current Medications: Current Meds  Medication Sig   acetaminophen (TYLENOL) 500 MG tablet Take 1,000 mg by mouth every 6 (six) hours as needed for moderate pain.   albuterol (VENTOLIN HFA) 108 (90 Base) MCG/ACT inhaler inhale 2 puffs by mouth every 4 to 6 hours as needed (Patient taking differently: Inhale 2 puffs into the lungs every 4 (four) hours as needed for wheezing or shortness of breath. inhale 2 puffs by mouth every 4 to 6 hours as needed)    Azelastine HCl 137 MCG/SPRAY SOLN PLACE 2 SPRAYS INTO BOTH NOSTRILS 2 (TWO) TIMES DAILY (Patient taking differently: Place 2 sprays into both nostrils 2 (two) times daily.)   BREZTRI AEROSPHERE 160-9-4.8 MCG/ACT AERO Inhale two puffs twice daily to prevent cough or wheeze. Rinse mouth after use. (Patient taking differently: Inhale 2 puffs into the lungs 2 (two) times daily. Inhale two puffs twice daily to prevent cough or wheeze. Rinse mouth after use.)   Cholecalciferol (VITAMIN D3) 50 MCG (2000 UT) CAPS Take 1 capsule by mouth daily.   diazepam (VALIUM) 10 MG tablet Take 5 mg by mouth every 6 (six) hours as needed for anxiety.   hydrocortisone (ANUSOL-HC) 2.5 % rectal cream Place 1 application rectally daily.   metoprolol tartrate (LOPRESSOR) 25 MG tablet Take 0.5 tablets (12.5 mg total) by mouth 2 (two) times daily.   mometasone (NASONEX) 50 MCG/ACT nasal spray Use one spray in each nostril 1-2 times daily (Patient taking differently: Place 2 sprays into the nose daily. Use one spray in each nostril 1-2 times daily)   omeprazole (PRILOSEC) 20 MG capsule Take one capsule once daily (Patient taking differently: Take 20 mg by mouth daily. Take one capsule once daily)   zafirlukast (ACCOLATE) 20 MG tablet TAKE 1 TABLET BY MOUTH TWICE A DAY (Patient taking differently: Take 20 mg by mouth 2 (two) times daily before a meal. TAKE 1 TABLET BY MOUTH TWICE A DAY)   [DISCONTINUED] ELIQUIS 5 MG TABS  tablet TAKE 1 TABLET BY MOUTH TWICE A DAY      EKGs/Labs/Other Studies Reviewed:    The following studies were reviewed today:  Cardiac Studies & Procedures       ECHOCARDIOGRAM  ECHOCARDIOGRAM COMPLETE 01/23/2020  Narrative ECHOCARDIOGRAM REPORT    Patient Name:   Ross Blakely. Date of Exam: 01/23/2020 Medical Rec #:  [id number]        Height:       70.0 in Accession #:    [id number]       Weight:       177.6 lb Date of Birth:  09/21/1950       BSA:          1.985 m Patient Age:    72 years          BP:           118/70 mmHg Patient Gender: M                HR:           55 bpm. Exam Location:  [address]  Procedure: 2D Echo, Cardiac Doppler and Color Doppler  Indications:    I48.91* Unspecified atrial fibrillation  History:        Patient has prior history of Echocardiogram examinations, most recent 05/01/2019. CHF. Moderate asthma. Acquired thrombophilia.  Sonographer:    Cathie Beams RCS Referring Phys: [phone] [place]   Sonographer Comments: Global longitudinal strain was attempted. IMPRESSIONS   1. Left ventricular ejection fraction, by estimation, is 55 to 60%. The left ventricle has normal function. The left ventricle has no regional wall motion abnormalities. Left ventricular diastolic parameters were normal. 2. Right ventricular systolic function is normal. The right ventricular size is normal. 3. Left atrial size was moderately dilated. 4. The mitral valve is normal in structure. Trivial mitral valve regurgitation. No evidence of mitral stenosis. 5. The aortic valve is tricuspid. Aortic valve regurgitation is not visualized. No aortic stenosis is present. 6. The inferior vena cava is normal in size with greater than 50% respiratory variability, suggesting right atrial pressure of 3 mmHg.  FINDINGS Left Ventricle: Left ventricular ejection fraction, by estimation, is 55 to 60%. The left ventricle has normal function. The left ventricle has no regional wall motion abnormalities. The left ventricular internal cavity size was normal in size. There is no left ventricular hypertrophy. Left ventricular diastolic parameters were normal.  Right Ventricle: The right ventricular size is normal. No increase in right ventricular wall thickness. Right ventricular systolic function is normal.  Left Atrium: Left atrial size was moderately dilated.  Right Atrium: Right atrial size was normal in size.  Pericardium: There is no evidence of pericardial effusion.  Mitral  Valve: The mitral valve is normal in structure. There is mild thickening of the mitral valve leaflet(s). There is mild calcification of the mitral valve leaflet(s). Trivial mitral valve regurgitation. No evidence of mitral valve stenosis.  Tricuspid Valve: The tricuspid valve is normal in structure. Tricuspid valve regurgitation is mild . No evidence of tricuspid stenosis.  Aortic Valve: The aortic valve is tricuspid. Aortic valve regurgitation is not visualized. No aortic stenosis is present.  Pulmonic Valve: The pulmonic valve was normal in structure. Pulmonic valve regurgitation is mild. No evidence of pulmonic stenosis.  Aorta: The aortic root is normal in size and structure.  Venous: The inferior vena cava is normal in size with greater than 50% respiratory variability, suggesting right atrial pressure of 3 mmHg.  IAS/Shunts: The interatrial septum appears to be lipomatous. No atrial level shunt detected by color flow Doppler.   LEFT VENTRICLE PLAX 2D LVIDd:         4.80 cm  Diastology LVIDs:         2.80 cm  LV e' medial:    5.33 cm/s LV PW:         1.10 cm  LV E/e' medial:  14.6 LV IVS:        1.00 cm  LV e' lateral:   10.20 cm/s LVOT diam:     2.10 cm  LV E/e' lateral: 7.6 LV SV:         88 LV SV Index:   45 LVOT Area:     3.46 cm   RIGHT VENTRICLE RV Basal diam:  3.60 cm RV S prime:     14.50 cm/s TAPSE (M-mode): 2.5 cm RVSP:           26.8 mmHg  LEFT ATRIUM             Index       RIGHT ATRIUM           Index LA diam:        4.40 cm 2.22 cm/m  RA Pressure: 3.00 mmHg LA Vol (A2C):   88.8 ml 44.75 ml/m RA Area:     14.50 cm LA Vol (A4C):   77.9 ml 39.25 ml/m RA Volume:   39.90 ml  20.11 ml/m LA Biplane Vol: 88.6 ml 44.64 ml/m AORTIC VALVE LVOT Vmax:   124.00 cm/s LVOT Vmean:  73.500 cm/s LVOT VTI:    0.255 m  AORTA Ao Root diam: 3.50 cm  MITRAL VALVE               TRICUSPID VALVE MV Area (PHT): 3.28 cm    TR Peak grad:   23.8 mmHg MV Decel Time: 231  msec    TR Vmax:        244.00 cm/s MV E velocity: 78.00 cm/s  Estimated RAP:  3.00 mmHg MV A velocity: 41.60 cm/s  RVSP:           26.8 mmHg MV E/A ratio:  1.88 SHUNTS Systemic VTI:  0.26 m Systemic Diam: 2.10 cm  Charlton Haws MD Electronically signed by Charlton Haws MD Signature Date/Time: 01/23/2020/3:22:48 PM    Final                 Recent Labs: 11/23/2022: BUN 22; Creatinine, Ser 0.94; Hemoglobin 14.9; Platelets 188; Potassium 3.8; Sodium 138  Recent Lipid Panel No results found for: "CHOL", "TRIG", "HDL", "CHOLHDL", "VLDL", "LDLCALC", "LDLDIRECT"  Physical Exam:    VS:  There were no vitals taken for this visit.    Wt Readings from Last 3 Encounters:  11/23/22 188 lb 6.4 oz (85.5 kg)  11/22/21 185 lb 3.2 oz (84 kg)  06/02/21 188 lb (85.3 kg)     GEN: *** Well nourished, well developed in no acute distress HEENT: Normal NECK: No JVD; No carotid bruits LYMPHATICS: No lymphadenopathy CARDIAC: ***RRR, no murmurs, rubs, gallops RESPIRATORY:  Clear to auscultation without rales, wheezing or rhonchi  ABDOMEN: Soft, non-tender, non-distended MUSCULOSKELETAL:  No edema; No deformity  SKIN: Warm and dry NEUROLOGIC:  Alert and oriented x 3 PSYCHIATRIC:  Normal affect    Signed, Norman Herrlich, MD  01/02/2023 5:02 PM    [facility]

## 2023-01-03 ENCOUNTER — Ambulatory Visit: Payer: Medicare Other | Attending: Cardiology | Admitting: Cardiology

## 2023-01-03 ENCOUNTER — Encounter: Payer: Self-pay | Admitting: Cardiology

## 2023-01-03 VITALS — BP 122/78 | HR 54 | Ht 68.5 in | Wt 185.8 lb

## 2023-01-03 DIAGNOSIS — Z7901 Long term (current) use of anticoagulants: Secondary | ICD-10-CM

## 2023-01-03 DIAGNOSIS — I2510 Atherosclerotic heart disease of native coronary artery without angina pectoris: Secondary | ICD-10-CM

## 2023-01-03 DIAGNOSIS — I480 Paroxysmal atrial fibrillation: Secondary | ICD-10-CM | POA: Diagnosis present

## 2023-01-03 DIAGNOSIS — I110 Hypertensive heart disease with heart failure: Secondary | ICD-10-CM | POA: Diagnosis present

## 2023-01-03 MED ORDER — ROSUVASTATIN CALCIUM 10 MG PO TABS: 10.0000 mg | ORAL_TABLET | Freq: Every day | ORAL | 3 refills | Status: DC

## 2023-01-03 MED ORDER — METOPROLOL TARTRATE 25 MG PO TABS: 12.5000 mg | ORAL_TABLET | Freq: Every day | ORAL | 3 refills | Status: AC

## 2023-01-03 NOTE — Patient Instructions (Addendum)
Medication Instructions:  Your physician has recommended you make the following change in your medication:   START: Metoprolol tartrate 12.5 mg daily START: Rosuvastatin 10 mg daily  *If you need a refill on your cardiac medications before your next appointment, please call your pharmacy*   Lab Work: Your physician recommends that you return for lab work in:   Labs in 2 months: CMP, Lipids  If you have labs (blood work) drawn today and your tests are completely normal, you will receive your results only by: MyChart Message (if you have MyChart) OR A paper copy in the mail If you have any lab test that is abnormal or we need to change your treatment, we will call you to review the results.   Testing/Procedures: Your physician has requested that you have an echocardiogram. Echocardiography is a painless test that uses sound waves to create images of your heart. It provides your doctor with information about the size and shape of your heart and how well your heart's chambers and valves are working. This procedure takes approximately one hour. There are no restrictions for this procedure. Please do NOT wear cologne, perfume, aftershave, or lotions (deodorant is allowed). Please arrive 15 minutes prior to your appointment time.  Please note: We ask at that you not bring children with you during ultrasound (echo/ vascular) testing. Due to room size and safety concerns, children are not allowed in the ultrasound rooms during exams. Our front office staff cannot provide observation of children in our lobby area while testing is being conducted. An adult accompanying a patient to their appointment will only be allowed in the ultrasound room at the discretion of the ultrasound technician under special circumstances. We apologize for any inconvenience.    Follow-Up: At [facility], you and your health needs are our priority.  As part of our continuing mission to provide you with  exceptional heart care, we have created designated Provider Care Teams.  These Care Teams include your primary Cardiologist (physician) and Advanced Practice Providers (APPs -  Physician Assistants and Nurse Practitioners) who all work together to provide you with the care you need, when you need it.  We recommend signing up for the patient portal called "MyChart".  Sign up information is provided on this After Visit Summary.  MyChart is used to connect with patients for Virtual Visits (Telemedicine).  Patients are able to view lab/test results, encounter notes, upcoming appointments, etc.  Non-urgent messages can be sent to your provider as well.   To learn more about what you can do with MyChart, go to [email or web address].    Your next appointment:   3 month(s)  Provider:   Norman Herrlich, MD    Other Instructions Contact the office if heart rate is less than 60 bpm over the next 1 - 2 weeks.

## 2023-02-26 ENCOUNTER — Ambulatory Visit: Payer: Medicare Other | Attending: Cardiology

## 2023-02-26 DIAGNOSIS — I110 Hypertensive heart disease with heart failure: Secondary | ICD-10-CM | POA: Insufficient documentation

## 2023-02-26 DIAGNOSIS — I480 Paroxysmal atrial fibrillation: Secondary | ICD-10-CM | POA: Diagnosis present

## 2023-02-26 DIAGNOSIS — Z7901 Long term (current) use of anticoagulants: Secondary | ICD-10-CM | POA: Insufficient documentation

## 2023-02-26 LAB — ECHOCARDIOGRAM COMPLETE: S' Lateral: 3.6 cm

## 2023-04-02 ENCOUNTER — Encounter: Payer: Self-pay | Admitting: Cardiology

## 2023-04-02 DIAGNOSIS — J45909 Unspecified asthma, uncomplicated: Secondary | ICD-10-CM | POA: Insufficient documentation

## 2023-04-05 ENCOUNTER — Ambulatory Visit: Payer: Medicare Other | Attending: Cardiology | Admitting: Cardiology

## 2023-04-05 ENCOUNTER — Encounter: Payer: Self-pay | Admitting: Cardiology

## 2023-04-05 VITALS — BP 148/86 | HR 70 | Ht 70.0 in | Wt 183.4 lb

## 2023-04-05 DIAGNOSIS — I110 Hypertensive heart disease with heart failure: Secondary | ICD-10-CM | POA: Insufficient documentation

## 2023-04-05 DIAGNOSIS — I2510 Atherosclerotic heart disease of native coronary artery without angina pectoris: Secondary | ICD-10-CM | POA: Insufficient documentation

## 2023-04-05 DIAGNOSIS — R001 Bradycardia, unspecified: Secondary | ICD-10-CM | POA: Diagnosis present

## 2023-04-05 DIAGNOSIS — I480 Paroxysmal atrial fibrillation: Secondary | ICD-10-CM | POA: Diagnosis not present

## 2023-04-05 DIAGNOSIS — E785 Hyperlipidemia, unspecified: Secondary | ICD-10-CM | POA: Diagnosis present

## 2023-04-05 DIAGNOSIS — Z7901 Long term (current) use of anticoagulants: Secondary | ICD-10-CM | POA: Diagnosis present

## 2023-04-05 NOTE — Progress Notes (Signed)
Cardiology Office Note:    Date:  04/05/2023   ID:  Ross Robinson., DOB 1951-01-10, MRN [id number]  PCP:  Jerrye Bushy, FNP  Cardiologist:  Norman Herrlich, MD    Referring MD: Jerrye Bushy, FNP    ASSESSMENT:    1. Paroxysmal atrial fibrillation (HCC)   2. Chronic anticoagulation   3. Bradycardia with 41-50 beats per minute   4. Hypertensive heart disease with heart failure (HCC)   5. Coronary artery calcification seen on CT scan   6. Dyslipidemia    PLAN:    In order of problems listed above:  Erland continues to do well with atrial fibrillation after ablation no clinical recurrence he will continue his anticoagulant and beta-blocker and continue to self monitor with his mobile Kardia device and I told him if he has strips that are concerning He will continue his current antibiotic Stable he will continue to self monitor and he takes a small dose of beta-blocker we will continue it No longer requires a loop diuretic Continue his high intensity statin With a very high coronary calcium score he will have a vascular screen performed   Next appointment: 6 months   Medication Adjustments/Labs and Tests Ordered: Current medicines are reviewed at length with the patient today.  Concerns regarding medicines are outlined above.  Orders Placed This Encounter  Procedures   VAS US VASCUSCREEN   No orders of the defined types were placed in this encounter.    History of Present Illness:    Ross Robinson. is a 73 y.o. male with a hx of atrial fibrillation with EP catheter ablation 08/12/2019 chronic anticoagulation elevated coronary calcium score of 946 hypertensive heart disease with heart failure previous EF 45 to 50% last seen 01/03/2023 with bradycardia and ectopic atrial rhythm.  He had an echocardiogram performed after the visit 02/26/2023 showed mild LVH recovery of ejection fraction 60 to 65% and normal GLS.  Right ventricle was normal in size function the left atrium  was severely dilated there was mild to moderate mitral regurgitation.  Compliance with diet, lifestyle and medications: Yes  Has had no clinical recurrence of atrial fibrillation he self monitors with the Kardia device I looked at the strips some of them are called unclassified but he has APCs are he just has baseline artifact He has stable exertional shortness of breath due to asthma he has had no chest pain edema orthopnea palpitation or syncope Has had no bleeding from his anticoagulant He is on lipid-lowering therapy without muscle pain or weakness. He is had a recent lipid profile and I cannot access those results. 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   albuterol (VENTOLIN HFA) 108 (90 Base) MCG/ACT inhaler TAKE 2 PUFFS BY MOUTH EVERY 4 TO 6 HOURS AS NEEDED   apixaban (ELIQUIS) 5 MG TABS tablet Take 1 tablet (5 mg total) by mouth 2 (two) times daily.   Azelastine HCl 137 MCG/SPRAY SOLN PLACE 2 SPRAYS INTO BOTH NOSTRILS  2 (TWO) TIMES DAILY   BREZTRI AEROSPHERE 160-9-4.8 MCG/ACT AERO Inhale two puffs twice daily to prevent cough or wheeze. Rinse mouth after use.   Cholecalciferol (VITAMIN D3) 50 MCG (2000 UT) CAPS Take 1 capsule by mouth daily.   diazepam (VALIUM) 10 MG tablet Take 5 mg by mouth every 6 (six) hours as needed for anxiety.   hydrocortisone (ANUSOL-HC) 2.5 % rectal cream Place 1 application rectally daily.   metoprolol tartrate (LOPRESSOR) 25 MG tablet Take 0.5 tablets  (12.5 mg total) by mouth daily.   mometasone (NASONEX) 50 MCG/ACT nasal spray Use one spray in each nostril 1-2 times daily   omeprazole (PRILOSEC) 20 MG capsule Take one capsule once daily   rosuvastatin (CRESTOR) 10 MG tablet Take 1 tablet (10 mg total) by mouth daily.   zafirlukast (ACCOLATE) 20 MG tablet TAKE 1 TABLET BY MOUTH TWICE A DAY      EKGs/Labs/Other Studies Reviewed:    The following studies were reviewed today:  Cardiac Studies & Procedures   ______________________________________________________________________________________________     ECHOCARDIOGRAM  ECHOCARDIOGRAM COMPLETE 02/26/2023  Narrative ECHOCARDIOGRAM REPORT    Patient Name:   Marcus Schwandt. Date of Exam: 02/26/2023 Medical Rec #:  [id number]        Height:       68.5 in Accession #:    [id number]       Weight:       185.8 lb Date of Birth:  28-Apr-1950       BSA:          1.992 m Patient Age:    72 years         BP:           110/78 mmHg Patient Gender: M                HR:           61 bpm. Exam Location:  [facility]  Procedure: 2D Echo, Cardiac Doppler, Color Doppler and Strain Analysis  Indications:    Hypertensive heart disease with heart failure (HCC) [I11.0 (ICD-10-CM)]  History:        Patient has prior history of Echocardiogram examinations, most recent 01/23/2020. CHF; Arrythmias:Atrial Fibrillation.  Sonographer:    Louie Boston RDCS Referring Phys: [id number] Ryley Bachtel J Keeton Kassebaum  IMPRESSIONS   1. Left ventricular ejection fraction, by estimation, is 60 to 65%. The left ventricle has normal function. The left ventricle has no regional wall motion abnormalities. There is mild left ventricular hypertrophy. Left ventricular diastolic parameters are consistent with Grade II diastolic dysfunction (pseudonormalization). The average left ventricular global longitudinal strain is -18.6 %. The global longitudinal strain is normal. 2. Right ventricular systolic function is normal. The right ventricular  size is normal. There is mildly elevated pulmonary artery systolic pressure. 3. Left atrial size was severely dilated. 4. The mitral valve is normal in structure. Mild to moderate mitral valve regurgitation. No evidence of mitral stenosis. 5. The aortic valve is normal in structure. Aortic valve regurgitation is trivial. No aortic stenosis is present. 6. The inferior vena cava is normal in size with greater than 50% respiratory variability, suggesting right atrial pressure of 3 mmHg.  FINDINGS Left Ventricle: Left ventricular ejection fraction, by estimation, is 60 to 65%. The left ventricle has normal function. The left ventricle has no regional wall motion abnormalities. The average left ventricular global longitudinal strain is -18.6 %. The global longitudinal strain is normal. The left ventricular internal cavity size was normal in  size. There is mild left ventricular hypertrophy. Left ventricular diastolic parameters are consistent with Grade II diastolic dysfunction (pseudonormalization).  Right Ventricle: The right ventricular size is normal. No increase in right ventricular wall thickness. Right ventricular systolic function is normal. There is mildly elevated pulmonary artery systolic pressure. The tricuspid regurgitant velocity is 3.21 m/s, and with an assumed right atrial pressure of 3 mmHg, the estimated right ventricular systolic pressure is 44.2 mmHg.  Left Atrium: Left atrial size was severely dilated.  Right Atrium: Right atrial size was normal in size.  Pericardium: There is no evidence of pericardial effusion.  Mitral Valve: The mitral valve is normal in structure. Mild to moderate mitral valve regurgitation. No evidence of mitral valve stenosis.  Tricuspid Valve: The tricuspid valve is normal in structure. Tricuspid valve regurgitation is mild . No evidence of tricuspid stenosis.  Aortic Valve: The aortic valve is normal in structure. Aortic valve regurgitation is trivial.  No aortic stenosis is present.  Pulmonic Valve: The pulmonic valve was normal in structure. Pulmonic valve regurgitation is not visualized. No evidence of pulmonic stenosis.  Aorta: The aortic root is normal in size and structure.  Venous: The inferior vena cava is normal in size with greater than 50% respiratory variability, suggesting right atrial pressure of 3 mmHg.  IAS/Shunts: No atrial level shunt detected by color flow Doppler.   LEFT VENTRICLE PLAX 2D LVIDd:         5.00 cm   Diastology LVIDs:         3.60 cm   LV e' medial:    6.85 cm/s LV PW:         1.30 cm   LV E/e' medial:  11.2 LV IVS:        1.40 cm   LV e' lateral:   10.70 cm/s LVOT diam:     2.00 cm   LV E/e' lateral: 7.2 LV SV:         83 LV SV Index:   42        2D Longitudinal Strain LVOT Area:     3.14 cm  2D Strain GLS Avg:     -18.6 %   RIGHT VENTRICLE             IVC RV Basal diam:  3.60 cm     IVC diam: 1.95 cm RV S prime:     14.40 cm/s TAPSE (M-mode): 2.3 cm  LEFT ATRIUM              Index        RIGHT ATRIUM           Index LA diam:        5.10 cm  2.56 cm/m   RA Area:     16.00 cm LA Vol (A2C):   112.0 ml 56.24 ml/m  RA Volume:   42.80 ml  21.49 ml/m LA Vol (A4C):   112.0 ml 56.24 ml/m LA Biplane Vol: 112.0 ml 56.24 ml/m AORTIC VALVE LVOT Vmax:   136.00 cm/s LVOT Vmean:  82.550 cm/s LVOT VTI:    0.265 m  AORTA Ao Root diam: 3.10 cm Ao Asc diam:  3.90 cm Ao Desc diam: 2.40 cm  MV E velocity: 77.05 cm/s  TRICUSPID VALVE MV A velocity: 33.80 cm/s  TR Peak grad:   41.2 mmHg MV E/A ratio:  2.28        TR Vmax:        321.00 cm/s  SHUNTS Systemic VTI:  0.26 m Systemic Diam: 2.00 cm  Gypsy Balsam MD Electronically signed by Gypsy Balsam MD Signature Date/Time: 02/26/2023/4:49:24 PM    Final          ______________________________________________________________________________________________          Recent Labs: 11/23/2022: BUN 22; Creatinine, Ser 0.94;  Hemoglobin 14.9; Platelets 188; Potassium 3.8; Sodium 138    Physical Exam:    VS:  BP (!) 148/86   Pulse 70   Ht 5\' 10"  (1.778 m)   Wt 183 lb 6.4 oz (83.2 kg)   SpO2 97%   BMI 26.32 kg/m     Wt Readings from Last 3 Encounters:  04/05/23 183 lb 6.4 oz (83.2 kg)  01/03/23 185 lb 12.8 oz (84.3 kg)  11/23/22 188 lb 6.4 oz (85.5 kg)     GEN:  Well nourished, well developed in no acute distress HEENT: Normal NECK: No JVD; No carotid bruits LYMPHATICS: No lymphadenopathy CARDIAC: RRR, no murmurs, rubs, gallops RESPIRATORY:  Clear to auscultation without rales, wheezing or rhonchi  ABDOMEN: Soft, non-tender, non-distended MUSCULOSKELETAL:  No edema; No deformity  SKIN: Warm and dry NEUROLOGIC:  Alert and oriented x 3 PSYCHIATRIC:  Normal affect    Signed, Norman Herrlich, MD  04/05/2023 3:11 PM     [facility]

## 2023-04-05 NOTE — Patient Instructions (Signed)
Medication Instructions:  Your physician recommends that you continue on your current medications as directed. Please refer to the Current Medication list given to you today.  *If you need a refill on your cardiac medications before your next appointment, please call your pharmacy*   Lab Work: None If you have labs (blood work) drawn today and your tests are completely normal, you will receive your results only by: MyChart Message (if you have MyChart) OR A paper copy in the mail If you have any lab test that is abnormal or we need to change your treatment, we will call you to review the results.   Testing/Procedures: Vascuscreen   Follow-Up: At [facility], you and your health needs are our priority.  As part of our continuing mission to provide you with exceptional heart care, we have created designated Provider Care Teams.  These Care Teams include your primary Cardiologist (physician) and Advanced Practice Providers (APPs -  Physician Assistants and Nurse Practitioners) who all work together to provide you with the care you need, when you need it.  We recommend signing up for the patient portal called "MyChart".  Sign up information is provided on this After Visit Summary.  MyChart is used to connect with patients for Virtual Visits (Telemedicine).  Patients are able to view lab/test results, encounter notes, upcoming appointments, etc.  Non-urgent messages can be sent to your provider as well.   To learn more about what you can do with MyChart, go to [email or web address].    Your next appointment:   1 year(s)  Provider:   Norman Herrlich, MD    Other Instructions Take a mega B vitamin OTC

## 2023-04-16 ENCOUNTER — Ambulatory Visit: Payer: Medicare Other | Attending: Cardiology

## 2023-04-16 ENCOUNTER — Encounter: Payer: Self-pay | Admitting: Cardiology

## 2023-04-16 DIAGNOSIS — I2510 Atherosclerotic heart disease of native coronary artery without angina pectoris: Secondary | ICD-10-CM

## 2023-04-16 DIAGNOSIS — R001 Bradycardia, unspecified: Secondary | ICD-10-CM

## 2023-04-16 DIAGNOSIS — Z7901 Long term (current) use of anticoagulants: Secondary | ICD-10-CM

## 2023-04-16 DIAGNOSIS — I480 Paroxysmal atrial fibrillation: Secondary | ICD-10-CM

## 2023-04-16 DIAGNOSIS — I110 Hypertensive heart disease with heart failure: Secondary | ICD-10-CM

## 2023-04-16 DIAGNOSIS — E785 Hyperlipidemia, unspecified: Secondary | ICD-10-CM

## 2023-05-15 ENCOUNTER — Encounter (HOSPITAL_COMMUNITY): Payer: Self-pay | Admitting: Cardiology

## 2023-05-18 ENCOUNTER — Telehealth: Payer: Self-pay | Admitting: Allergy and Immunology

## 2023-05-18 MED ORDER — MOMETASONE FUROATE 50 MCG/ACT NA SUSP: NASAL | 3 refills | Status: DC

## 2023-05-18 NOTE — Telephone Encounter (Signed)
Refill sent and patient informed.

## 2023-05-18 NOTE — Telephone Encounter (Signed)
 Patient is requesting a refill on his Mometasone nasal spray sent to [facility].

## 2023-06-11 ENCOUNTER — Other Ambulatory Visit: Payer: Self-pay | Admitting: *Deleted

## 2023-06-11 MED ORDER — AZELASTINE HCL 137 MCG/SPRAY NA SOLN: NASAL | 0 refills | Status: DC

## 2023-06-27 ENCOUNTER — Ambulatory Visit (INDEPENDENT_AMBULATORY_CARE_PROVIDER_SITE_OTHER): Admitting: Allergy and Immunology

## 2023-06-27 ENCOUNTER — Encounter: Payer: Self-pay | Admitting: Allergy and Immunology

## 2023-06-27 VITALS — BP 128/82 | HR 54 | Resp 16

## 2023-06-27 DIAGNOSIS — K219 Gastro-esophageal reflux disease without esophagitis: Secondary | ICD-10-CM

## 2023-06-27 DIAGNOSIS — J4550 Severe persistent asthma, uncomplicated: Secondary | ICD-10-CM | POA: Diagnosis not present

## 2023-06-27 DIAGNOSIS — J3089 Other allergic rhinitis: Secondary | ICD-10-CM | POA: Diagnosis not present

## 2023-06-27 MED ORDER — OMEPRAZOLE 20 MG PO CPDR: DELAYED_RELEASE_CAPSULE | ORAL | 3 refills | Status: AC

## 2023-06-27 MED ORDER — BREZTRI AEROSPHERE 160-9-4.8 MCG/ACT IN AERO: INHALATION_SPRAY | RESPIRATORY_TRACT | 3 refills | Status: AC

## 2023-06-27 MED ORDER — ALBUTEROL SULFATE HFA 108 (90 BASE) MCG/ACT IN AERS: INHALATION_SPRAY | RESPIRATORY_TRACT | 1 refills | Status: AC

## 2023-06-27 MED ORDER — AZELASTINE HCL 137 MCG/SPRAY NA SOLN: NASAL | 3 refills | Status: AC

## 2023-06-27 MED ORDER — MOMETASONE FUROATE 50 MCG/ACT NA SUSP: NASAL | 3 refills | Status: AC

## 2023-06-27 MED ORDER — ZAFIRLUKAST 20 MG PO TABS: ORAL_TABLET | ORAL | 3 refills | Status: DC

## 2023-06-27 NOTE — Progress Notes (Unsigned)
 [place] [facility] [place] - [place] - [place] - [place]   Follow-up Note  Referring Provider: Alveta Joiner, FNP Primary Provider: Alveta Joiner, FNP Date of Office Visit: 06/27/2023  Subjective:   Ross Robinson. (DOB: [birth year]) is a 73 y.o. male who [facility] in re-evaluation of the following:  HPI: Ross Robinson returns to this clinic in evaluation of asthma, allergic rhinoconjunctivitis, LPR.  I last saw him in his clinic 21 Jun 2022.  We started him on Breztri  during his last visit and he has definitely noticed that he has had a little more coughing and a lot more sputum production ever since he is been using his triple inhaler along with his leukotriene modifier.Ross Robinson  He may actually have a little bit more "air" but he definitely has more coughing and sputum production.  He does not really use a bronchodilator that often.  He has not required a systemic steroid or antibiotic for any type of airway issue.  His nose is doing very well while using Nasacort and azelastine .  His reflux is under very good control while using omeprazole .  He continues on metoprolol  and Eliquis  for his A-fib.  Allergies as of 06/27/2023       Reactions   Codeine Nausea And Vomiting   Biaxin [clarithromycin] Rash   Ceftin [cefuroxime Axetil] Rash        Medication List    acetaminophen  500 MG tablet Commonly known as: TYLENOL  Take 1,000 mg by mouth every 6 (six) hours as needed for moderate pain.   albuterol  108 (90 Base) MCG/ACT inhaler Commonly known as: VENTOLIN  HFA TAKE 2 PUFFS BY MOUTH EVERY 4 TO 6 HOURS AS NEEDED   apixaban  5 MG Tabs tablet Commonly known as: Eliquis  Take 1 tablet (5 mg total) by mouth 2 (two) times daily.   Azelastine  HCl 137 MCG/SPRAY Soln Use 1-2 sprays in each nostril 1-2 times daily   Breztri  Aerosphere 160-9-4.8 MCG/ACT Aero inhaler Generic drug: budeson-glycopyrrolate-formoterol  Inhale two puffs twice daily  to prevent cough or wheeze. Rinse mouth after use.   diazepam 10 MG tablet Commonly known as: VALIUM Take 5 mg by mouth every 6 (six) hours as needed for anxiety.   hydrocortisone 2.5 % rectal cream Commonly known as: ANUSOL-HC Place 1 application rectally daily.   metoprolol  tartrate 25 MG tablet Commonly known as: LOPRESSOR  Take 0.5 tablets (12.5 mg total) by mouth daily.   mometasone  50 MCG/ACT nasal spray Commonly known as: NASONEX  Use one spray in each nostril 1-2 times daily   omeprazole  20 MG capsule Commonly known as: PRILOSEC Take one capsule once daily   rosuvastatin  10 MG tablet Commonly known as: CRESTOR  Take 1 tablet (10 mg total) by mouth daily.   vitamin D3 50 MCG (2000 UT) Caps Take 1 capsule by mouth daily.   zafirlukast  20 MG tablet Commonly known as: ACCOLATE  TAKE 1 TABLET BY MOUTH TWICE A DAY    Past Medical History:  Diagnosis Date   Allergic rhinitis    Asthma    Chronic diastolic heart failure (HCC) 06/25/2019   Hypercoagulable state due to persistent atrial fibrillation (HCC) 11/22/2021   LPRD (laryngopharyngeal reflux disease)    Moderate persistent asthma without complication 04/21/2019   Other allergic rhinitis 04/21/2019   Persistent atrial fibrillation [facility])     Past Surgical History:  Procedure Laterality Date   ADENOIDECTOMY     ATRIAL FIBRILLATION ABLATION N/A 08/12/2019   Procedure: ATRIAL FIBRILLATION  ABLATION;  Surgeon: Jolly Needle, MD;  Location: [facility] INVASIVE [facility];  Service: Cardiovascular;  Laterality: N/A;   COLON RESECTION     ENDOVENOUS ABLATION SAPHENOUS VEIN W/ LASER Right 04/06/2021   endovenous laser ablation right greater saphenous vein and stab phlebectomy > 20 incisions right leg by Kirtland Perfect MD   HERNIA REPAIR     SINOSCOPY     TONSILLECTOMY      Review of systems negative except as noted in HPI / PMHx or noted below:  Review of Systems  Constitutional: Negative.   HENT: Negative.    Eyes: Negative.    Respiratory: Negative.    Cardiovascular: Negative.   Gastrointestinal: Negative.   Genitourinary: Negative.   Musculoskeletal: Negative.   Skin: Negative.   Neurological: Negative.   Endo/Heme/Allergies: Negative.   Psychiatric/Behavioral: Negative.       Objective:   Vitals:   06/27/23 1346  BP: 128/82  Pulse: (!) 54  Resp: 16  SpO2: 94%          Physical Exam Constitutional:      Appearance: He is not diaphoretic.  HENT:     Head: Normocephalic.     Right Ear: Tympanic membrane, ear canal and external ear normal.     Left Ear: Tympanic membrane, ear canal and external ear normal.     Nose: Nose normal. No mucosal edema or rhinorrhea.     Mouth/Throat:     Pharynx: Uvula midline. No oropharyngeal exudate.  Eyes:     Conjunctiva/sclera: Conjunctivae normal.  Neck:     Thyroid: No thyromegaly.     Trachea: Trachea normal. No tracheal tenderness or tracheal deviation.  Cardiovascular:     Rate and Rhythm: Normal rate and regular rhythm.     Heart sounds: Normal heart sounds, S1 normal and S2 normal. No murmur heard. Pulmonary:     Effort: No respiratory distress.     Breath sounds: Normal breath sounds. No stridor. No wheezing or rales.  Lymphadenopathy:     Head:     Right side of head: No tonsillar adenopathy.     Left side of head: No tonsillar adenopathy.     Cervical: No cervical adenopathy.  Skin:    Findings: No erythema or rash.     Nails: There is no clubbing.  Neurological:     Mental Status: He is alert.     Diagnostics: Spirometry was performed and demonstrated an FEV1 of 2.76 at 87 % of predicted.  Assessment and Plan:   1. Not well controlled severe persistent asthma   2. Other allergic rhinitis   3. LPRD (laryngopharyngeal reflux disease)      1. Continue BREZTRI  - 2 inhalations twice a day  2. Continue Accolate  20 mg twice a day  3. Continue Nasonex  one spray each nostril 1-2 times a day  4. Continue omeprazole  20 mg  daily  5. If needed:    C. Astelin    B. Albuterol  HFA  C. Mucinex DM  6. Submit for tezepelumab approval - Tammy  7. Return to clinic in 12 months or earlier if problem  It is interesting to note that since we have started rentals on a triple inhaler his airflow has improved as measured by his spirometry but he has had more coughing and much more sputum production.  This would suggest that he is now able to eliminate some of the mucus production that has been stuck in his airway because his airway is now a larger caliber.  That raises the issue of his mucus production not being addressed by his triple inhaler and I think we are going to try him on a trial of tezepelumab to see if we can lower his mucus burden.  We will see if his insurance company will pay for that agent.  He will continue on a triple inhaler and a leukotriene modifier and a nasal steroid to address his respiratory tract inflammation and he will also remain on therapy for reflux as noted above.  Schuyler Custard, MD Allergy / Immunology  [facility]

## 2023-06-27 NOTE — Patient Instructions (Signed)
  1. Continue BREZTRI  - 2 inhalations twice a day  2. Continue Accolate  20 mg twice a day  3. Continue Nasonex  one spray each nostril 1-2 times a day  4. Continue omeprazole  20 mg daily  5. If needed:    C. Astelin    B. Albuterol  HFA  C. Mucinex DM  6. Submit for tezepelumab approval - Tammy  7. Return to clinic in 12 months or earlier if problem

## 2023-06-28 ENCOUNTER — Encounter: Payer: Self-pay | Admitting: Allergy and Immunology

## 2023-07-02 ENCOUNTER — Telehealth: Payer: Self-pay | Admitting: *Deleted

## 2023-07-02 NOTE — Telephone Encounter (Signed)
 T/c to  patient advised with Ins MCR/supplement his Tezspire would be paid by medical at 100% and appt made to start therapy

## 2023-07-02 NOTE — Telephone Encounter (Signed)
-----   Message from [facility] [place] T sent at 06/27/2023  2:19 PM EDT ----- Regarding: NEW START Please submit for Tezspire .

## 2023-07-02 NOTE — Telephone Encounter (Signed)
-----   Message from [facility] [place] T sent at 06/27/2023  2:19 PM EDT ----- Regarding: NEW START Please submit for Tezspire .

## 2023-07-04 ENCOUNTER — Ambulatory Visit

## 2023-07-04 DIAGNOSIS — J4550 Severe persistent asthma, uncomplicated: Secondary | ICD-10-CM

## 2023-07-04 MED ORDER — TEZEPELUMAB-EKKO 210 MG/1.91ML ~~LOC~~ SOSY
210.0000 mg | PREFILLED_SYRINGE | SUBCUTANEOUS | Status: AC
Start: 2023-07-04 — End: ?
  Administered 2023-07-04 – 2024-03-20 (×10): 210 mg via SUBCUTANEOUS

## 2023-07-04 NOTE — Progress Notes (Signed)
 Immunotherapy   Patient Details  Name: Ross Robinson. MRN: [id number] Date of Birth: 22-May-1950  07/04/2023  Ross Robinson.  Started Tezspire injection today for Asthma.  Frequency: Every 28 days Consent signed and patient instructions given. Patient waited 15 minutes in office without any issues.    Ross Robinson 07/04/2023, 3:01 PM

## 2023-07-31 ENCOUNTER — Telehealth: Payer: Self-pay

## 2023-07-31 ENCOUNTER — Ambulatory Visit (INDEPENDENT_AMBULATORY_CARE_PROVIDER_SITE_OTHER)

## 2023-07-31 DIAGNOSIS — J4550 Severe persistent asthma, uncomplicated: Secondary | ICD-10-CM | POA: Diagnosis not present

## 2023-07-31 NOTE — Telephone Encounter (Signed)
 Patient came in today for his 2nd Tezspire  injection. He states he has not noticed a big difference. He is still coughing a lot and spitting up a lot of phlegm.   Patient does have an history of joint pain and he has noticed that now it is more persistent then it used to be.  Please advice. Thank you.

## 2023-08-01 ENCOUNTER — Ambulatory Visit

## 2023-08-01 NOTE — Telephone Encounter (Signed)
 Left voicemail for patient to return call.

## 2023-08-02 MED ORDER — AZITHROMYCIN 250 MG PO TABS: ORAL_TABLET | ORAL | 0 refills | Status: DC

## 2023-08-02 NOTE — Addendum Note (Signed)
 Addended by: Cameran Pettey on: 08/02/2023 11:31 AM   Modules accepted: Orders

## 2023-08-02 NOTE — Telephone Encounter (Signed)
 Patient informed and verbalized understanding. Patient has taken Azithromycin before without any issues. Rx sent to [facility]

## 2023-08-07 ENCOUNTER — Telehealth: Payer: Self-pay | Admitting: Cardiology

## 2023-08-07 DIAGNOSIS — I480 Paroxysmal atrial fibrillation: Secondary | ICD-10-CM

## 2023-08-07 MED ORDER — APIXABAN 5 MG PO TABS: 5.0000 mg | ORAL_TABLET | Freq: Two times a day (BID) | ORAL | 1 refills | Status: DC

## 2023-08-07 NOTE — Telephone Encounter (Signed)
*  STAT* If patient is at the pharmacy, call can be transferred to refill team.   1. Which medications need to be refilled? (please list name of each medication and dose if known) apixaban  (ELIQUIS ) 5 MG TABS tablet    2. Would you like to learn more about the convenience, safety, & potential cost savings by using the [facility]?     3. Are you open to using the [facility] (Type [facility].  ).   4. Which pharmacy/location (including street and city if local pharmacy) is medication to be sent to? [id number] - [place] - [address]    5. Do they need a 30 day or 90 day supply? 90 day

## 2023-08-07 NOTE — Telephone Encounter (Signed)
 Prescription refill request for Eliquis  received. Indication: Afib  Last office visit: 04/05/23 [facility])  Scr: 0.94 (11/23/22)  Age: 73 Weight: 83.2kg  Appropriate dose. Refill sent.

## 2023-08-28 ENCOUNTER — Ambulatory Visit

## 2023-08-28 DIAGNOSIS — J4550 Severe persistent asthma, uncomplicated: Secondary | ICD-10-CM | POA: Diagnosis not present

## 2023-09-05 ENCOUNTER — Telehealth: Payer: Self-pay | Admitting: Cardiology

## 2023-09-05 NOTE — Telephone Encounter (Signed)
 Pt c/o medication issue:  1. Name of Medication: Vitamin B  2. How are you currently taking this medication (dosage and times per day)? N/A  3. Are you having a reaction (difficulty breathing--STAT)? no  4. What is your medication issue? Patient was told by Dr. Monetta to start taking a Mega B vitamin. He spoke with the pharmacist at his pharmacy and they said there is a Mega Red and a B Complex vitamin. Patient wants to know if Dr. Monetta  meant one of these supplements and if not then what dosage of Vitamin B should he be taking?

## 2023-09-06 NOTE — Telephone Encounter (Signed)
 Advised that Dr. Monetta is out of the office and will not return until 09/24/23. Pt verbalized understanding and had no additional questions.

## 2023-09-25 ENCOUNTER — Ambulatory Visit (INDEPENDENT_AMBULATORY_CARE_PROVIDER_SITE_OTHER): Admitting: *Deleted

## 2023-09-25 DIAGNOSIS — J4540 Moderate persistent asthma, uncomplicated: Secondary | ICD-10-CM

## 2023-10-23 ENCOUNTER — Ambulatory Visit (INDEPENDENT_AMBULATORY_CARE_PROVIDER_SITE_OTHER): Admitting: *Deleted

## 2023-10-23 DIAGNOSIS — J4540 Moderate persistent asthma, uncomplicated: Secondary | ICD-10-CM | POA: Diagnosis not present

## 2023-10-24 ENCOUNTER — Encounter: Payer: Self-pay | Admitting: Allergy and Immunology

## 2023-10-24 ENCOUNTER — Ambulatory Visit (INDEPENDENT_AMBULATORY_CARE_PROVIDER_SITE_OTHER): Admitting: Allergy and Immunology

## 2023-10-24 VITALS — BP 138/78 | HR 54 | Resp 16

## 2023-10-24 DIAGNOSIS — K219 Gastro-esophageal reflux disease without esophagitis: Secondary | ICD-10-CM | POA: Diagnosis not present

## 2023-10-24 DIAGNOSIS — M2550 Pain in unspecified joint: Secondary | ICD-10-CM | POA: Diagnosis not present

## 2023-10-24 DIAGNOSIS — J3089 Other allergic rhinitis: Secondary | ICD-10-CM

## 2023-10-24 DIAGNOSIS — M791 Myalgia, unspecified site: Secondary | ICD-10-CM

## 2023-10-24 DIAGNOSIS — J4540 Moderate persistent asthma, uncomplicated: Secondary | ICD-10-CM

## 2023-10-24 DIAGNOSIS — B999 Unspecified infectious disease: Secondary | ICD-10-CM

## 2023-10-24 NOTE — Patient Instructions (Addendum)
  1. Continue Tezepelumab  injections  2. Continue BREZTRI  - 2 inhalations twice a day  3. Continue Accolate  20 mg - 1 tablet twice a day  4. Continue Nasonex  - 1 spray each nostril 1-2 times a day  5. Continue omeprazole  20 mg daily  6. If needed:    C. Astelin    B. Albuterol  HFA  C. Mucinex DM  7. Further evaluation:   A. Chest X-ray  B. Blood - CBC w/d, CMP, IgA/G/M, SED, CRP  C. Side effect from tezepelumab ???  8. Return to clinic in 12 months or earlier if problem  9. Influenza = Tamiflu. Covid = Paxlovid

## 2023-10-24 NOTE — Progress Notes (Unsigned)
 [name] [facility] [place] [facility] [place] - [place] - [place]   Follow-up Note  Referring Provider: Carlon Mitzie SAUNDERS, FNP Primary Provider: Carlon Mitzie SAUNDERS, FNP Date of Office Visit: 10/24/2023  Subjective:   Ross Robinson. (DOB: May 12, 1950) is a 73 y.o. male who [facility] in re-evaluation of the following:  HPI: [name] returns to this clinic in evaluation of asthma, allergic rhinoconjunctivitis, LPR.  I last saw him in this clinic 27 Jun 2023.  During his last visit he appeared to have persistent respiratory tract symptoms with coughing and sputum production even though he was using a triple inhaler and a leukotriene modifier and we started him on tezepelumab .  His respiratory tract appears to be doing pretty well although he still occasionally has these bouts of coughing and yellow sputum and has been to his primary care doctor and has received 2 antibiotics, 1 in June and 1 in August for this issue.  At 1 point he had some blood-tinged sputum.  Currently he just does not feel well in general.  He has very little energy and he is achy in his muscles and joints in general and he has to take some Tylenol .  He wonders if he is having a side effect from the tezepelumab .  His upper airway has been doing very well.  He has had no problems with reflux or LPR.  Allergies as of 10/24/2023       Reactions   Codeine Nausea And Vomiting   Biaxin [clarithromycin] Rash   Ceftin [cefuroxime Axetil] Rash        Medication List    acetaminophen  500 MG tablet Commonly known as: TYLENOL  Take 1,000 mg by mouth every 6 (six) hours as needed for moderate pain.   albuterol  108 (90 Base) MCG/ACT inhaler Commonly known as: VENTOLIN  HFA TAKE 2 PUFFS BY MOUTH EVERY 4 TO 6 HOURS AS NEEDED   apixaban  5 MG Tabs tablet Commonly known as: Eliquis  Take 1 tablet (5 mg total) by mouth 2 (two) times daily.   Azelastine  HCl 137 MCG/SPRAY Soln Use 1-2 sprays in  each nostril 1-2 times daily   azithromycin  250 MG tablet Commonly known as: Zithromax  1 tablet one time per day for 10 days only   Breztri  Aerosphere 160-9-4.8 MCG/ACT Aero inhaler Generic drug: budesonide -glycopyrrolate-formoterol  Inhale two puffs twice daily to prevent cough or wheeze. Rinse mouth after use.   diazepam 10 MG tablet Commonly known as: VALIUM Take 5 mg by mouth every 6 (six) hours as needed for anxiety.   hydrocortisone 2.5 % rectal cream Commonly known as: ANUSOL-HC Place 1 application rectally daily.   metoprolol  tartrate 25 MG tablet Commonly known as: LOPRESSOR  Take 0.5 tablets (12.5 mg total) by mouth daily.   mometasone  50 MCG/ACT nasal spray Commonly known as: NASONEX  Use one spray in each nostril 1-2 times daily   omeprazole  20 MG capsule Commonly known as: PRILOSEC Take one capsule once daily   rosuvastatin  10 MG tablet Commonly known as: CRESTOR  Take 1 tablet (10 mg total) by mouth daily.   vitamin D3 50 MCG (2000 UT) Caps Take 1 capsule by mouth daily.   zafirlukast  20 MG tablet Commonly known as: ACCOLATE  TAKE 1 TABLET BY MOUTH TWICE A DAY    Past Medical History:  Diagnosis Date   Allergic rhinitis    Asthma    Chronic diastolic heart failure (HCC) 06/25/2019   Hypercoagulable state due to persistent atrial fibrillation (HCC) 11/22/2021  LPRD (laryngopharyngeal reflux disease)    Moderate persistent asthma without complication 04/21/2019   Other allergic rhinitis 04/21/2019   Persistent atrial fibrillation (HCC)     Past Surgical History:  Procedure Laterality Date   ADENOIDECTOMY     ATRIAL FIBRILLATION ABLATION N/A 08/12/2019   Procedure: ATRIAL FIBRILLATION ABLATION;  Surgeon: Kelsie Agent, MD;  Location: [facility];  Service: Cardiovascular;  Laterality: N/A;   COLON RESECTION     ENDOVENOUS ABLATION SAPHENOUS VEIN W/ LASER Right 04/06/2021   endovenous laser ablation right greater saphenous vein and stab  phlebectomy > 20 incisions right leg by Medford Blade MD   HERNIA REPAIR     SINOSCOPY     TONSILLECTOMY      Review of systems negative except as noted in HPI / PMHx or noted below:  Review of Systems  Constitutional: Negative.   HENT: Negative.    Eyes: Negative.   Respiratory: Negative.    Cardiovascular: Negative.   Gastrointestinal: Negative.   Genitourinary: Negative.   Musculoskeletal: Negative.   Skin: Negative.   Neurological: Negative.   Endo/Heme/Allergies: Negative.   Psychiatric/Behavioral: Negative.       Objective:   Vitals:   10/24/23 1158  BP: 138/78  Pulse: (!) 54  Resp: 16  SpO2: 95%          Physical Exam Constitutional:      Appearance: He is not diaphoretic.  HENT:     Head: Normocephalic.     Right Ear: Tympanic membrane, ear canal and external ear normal.     Left Ear: Tympanic membrane, ear canal and external ear normal.     Nose: Nose normal. No mucosal edema or rhinorrhea.     Mouth/Throat:     Pharynx: Uvula midline. No oropharyngeal exudate.  Eyes:     Conjunctiva/sclera: Conjunctivae normal.  Neck:     Thyroid: No thyromegaly.     Trachea: Trachea normal. No tracheal tenderness or tracheal deviation.  Cardiovascular:     Rate and Rhythm: Normal rate and regular rhythm.     Heart sounds: Normal heart sounds, S1 normal and S2 normal. No murmur heard. Pulmonary:     Effort: No respiratory distress.     Breath sounds: Normal breath sounds. No stridor. No wheezing or rales.  Lymphadenopathy:     Head:     Right side of head: No tonsillar adenopathy.     Left side of head: No tonsillar adenopathy.     Cervical: No cervical adenopathy.  Skin:    Findings: No erythema or rash.     Nails: There is no clubbing.  Neurological:     Mental Status: He is alert.     Diagnostics: Spirometry was performed and demonstrated an FEV1 of 2.32 at 74 % of predicted.  Assessment and Plan:   1. Asthma, moderate persistent, well-controlled    2. Other allergic rhinitis   3. LPRD (laryngopharyngeal reflux disease)   4. Arthralgia, unspecified joint   5. Myalgia   6. Recurrent infections      1. Continue Tezepelumab  injections  2. Continue BREZTRI  - 2 inhalations twice a day  3. Continue Accolate  20 mg - 1 tablet twice a day  4. Continue Nasonex  - 1 spray each nostril 1-2 times a day  5. Continue omeprazole  20 mg daily  6. If needed:    C. Astelin    B. Albuterol  HFA  C. Mucinex DM  7. Further evaluation:   A. Chest X-ray  B. Blood -  CBC w/d, CMP, IgA/G/M, SED, CRP  C. Side effect from tezepelumab ???  8. Return to clinic in 12 months or earlier if problem  9. Influenza = Tamiflu. Covid = Paxlovid  [name] appears to be developing some problems with recurrent infections and some diffuse constitutional symptoms and had a little bit of hemoptysis as well and we need to further explore this issue with imaging of his chest and some blood tests as noted above.  He could be having a side effect from his tezepelumab  and we will need to make a decision about continuing this agent based upon these results and how he does over the course of the next few months.  He will continue on a collection of anti-inflammatory agents for his airway as well as therapy for his LPR.  I will contact him with the results of his test once they are available for review.  Camellia Denis, MD Allergy / Immunology [facility]

## 2023-10-25 ENCOUNTER — Ambulatory Visit (INDEPENDENT_AMBULATORY_CARE_PROVIDER_SITE_OTHER)
Admission: RE | Admit: 2023-10-25 | Discharge: 2023-10-25 | Disposition: A | Discharge status: Home / Self Care | Source: Ambulatory Visit | Attending: Allergy and Immunology | Admitting: Allergy and Immunology

## 2023-10-25 ENCOUNTER — Encounter: Payer: Self-pay | Admitting: Allergy and Immunology

## 2023-10-25 DIAGNOSIS — M2550 Pain in unspecified joint: Secondary | ICD-10-CM

## 2023-10-25 DIAGNOSIS — M791 Myalgia, unspecified site: Secondary | ICD-10-CM

## 2023-10-25 DIAGNOSIS — R058 Other specified cough: Secondary | ICD-10-CM | POA: Diagnosis not present

## 2023-10-25 DIAGNOSIS — B999 Unspecified infectious disease: Secondary | ICD-10-CM

## 2023-10-25 DIAGNOSIS — J4540 Moderate persistent asthma, uncomplicated: Secondary | ICD-10-CM

## 2023-10-26 LAB — CBC WITH DIFFERENTIAL/PLATELET
Basophils Absolute: 0 x10E3/uL (ref 0.0–0.2)
Basos: 0 %
EOS (ABSOLUTE): 0.2 x10E3/uL (ref 0.0–0.4)
Eos: 3 %
Hematocrit: 44.2 % (ref 37.5–51.0)
Hemoglobin: 14.6 g/dL (ref 13.0–17.7)
Immature Grans (Abs): 0 x10E3/uL (ref 0.0–0.1)
Immature Granulocytes: 0 %
Lymphocytes Absolute: 2 x10E3/uL (ref 0.7–3.1)
Lymphs: 29 %
MCH: 34 pg — ABNORMAL HIGH (ref 26.6–33.0)
MCHC: 33 g/dL (ref 31.5–35.7)
MCV: 103 fL — ABNORMAL HIGH (ref 79–97)
Monocytes Absolute: 0.6 x10E3/uL (ref 0.1–0.9)
Monocytes: 9 %
Neutrophils Absolute: 4 x10E3/uL (ref 1.4–7.0)
Neutrophils: 59 %
Platelets: 196 x10E3/uL (ref 150–450)
RBC: 4.3 x10E6/uL (ref 4.14–5.80)
RDW: 11.9 % (ref 11.6–15.4)
WBC: 6.8 x10E3/uL (ref 3.4–10.8)

## 2023-10-26 LAB — IGG, IGA, IGM
IgA/Immunoglobulin A, Serum: 495 mg/dL — ABNORMAL HIGH (ref 61–437)
IgG (Immunoglobin G), Serum: 992 mg/dL (ref 603–1613)
IgM (Immunoglobulin M), Srm: 84 mg/dL (ref 15–143)

## 2023-10-26 LAB — COMPREHENSIVE METABOLIC PANEL WITH GFR
ALT: 17 IU/L (ref 0–44)
AST: 16 IU/L (ref 0–40)
Albumin: 4 g/dL (ref 3.8–4.8)
Alkaline Phosphatase: 74 IU/L (ref 44–121)
BUN/Creatinine Ratio: 23 (ref 10–24)
BUN: 20 mg/dL (ref 8–27)
Bilirubin Total: 0.6 mg/dL (ref 0.0–1.2)
CO2: 22 mmol/L (ref 20–29)
Calcium: 9.5 mg/dL (ref 8.6–10.2)
Chloride: 107 mmol/L — ABNORMAL HIGH (ref 96–106)
Creatinine, Ser: 0.87 mg/dL (ref 0.76–1.27)
Globulin, Total: 2.6 g/dL (ref 1.5–4.5)
Glucose: 96 mg/dL (ref 70–99)
Potassium: 4.9 mmol/L (ref 3.5–5.2)
Sodium: 144 mmol/L (ref 134–144)
Total Protein: 6.6 g/dL (ref 6.0–8.5)
eGFR: 92 mL/min/1.73 (ref >59)

## 2023-10-26 LAB — C-REACTIVE PROTEIN: CRP: 6 mg/L (ref 0–10)

## 2023-10-26 LAB — SEDIMENTATION RATE: Sed Rate: 17 mm/h (ref 0–30)

## 2023-10-29 ENCOUNTER — Ambulatory Visit: Payer: Self-pay | Admitting: Allergy and Immunology

## 2023-11-08 DIAGNOSIS — M7989 Other specified soft tissue disorders: Secondary | ICD-10-CM | POA: Insufficient documentation

## 2023-11-08 DIAGNOSIS — M25572 Pain in left ankle and joints of left foot: Secondary | ICD-10-CM | POA: Insufficient documentation

## 2023-11-08 DIAGNOSIS — M722 Plantar fascial fibromatosis: Secondary | ICD-10-CM | POA: Insufficient documentation

## 2023-11-12 ENCOUNTER — Other Ambulatory Visit: Payer: Self-pay | Admitting: Allergy and Immunology

## 2023-11-20 ENCOUNTER — Ambulatory Visit (INDEPENDENT_AMBULATORY_CARE_PROVIDER_SITE_OTHER)

## 2023-11-20 DIAGNOSIS — J4540 Moderate persistent asthma, uncomplicated: Secondary | ICD-10-CM

## 2023-12-18 ENCOUNTER — Ambulatory Visit: Admitting: *Deleted

## 2023-12-18 DIAGNOSIS — J4540 Moderate persistent asthma, uncomplicated: Secondary | ICD-10-CM | POA: Diagnosis not present

## 2024-01-07 ENCOUNTER — Other Ambulatory Visit: Payer: Self-pay | Admitting: Cardiology

## 2024-01-14 ENCOUNTER — Ambulatory Visit (INDEPENDENT_AMBULATORY_CARE_PROVIDER_SITE_OTHER): Admitting: *Deleted

## 2024-01-14 DIAGNOSIS — J4540 Moderate persistent asthma, uncomplicated: Secondary | ICD-10-CM | POA: Diagnosis not present

## 2024-01-15 ENCOUNTER — Ambulatory Visit

## 2024-01-30 ENCOUNTER — Other Ambulatory Visit: Payer: Self-pay | Admitting: Cardiology

## 2024-01-30 DIAGNOSIS — I480 Paroxysmal atrial fibrillation: Secondary | ICD-10-CM

## 2024-01-30 NOTE — Telephone Encounter (Signed)
 Prescription refill request for Eliquis  received. Indication:afib Last office visit:2/25 Scr: 0.87  9/25 Age:73 Weight:83.2  kg  Prescription refilled

## 2024-02-11 ENCOUNTER — Ambulatory Visit

## 2024-02-18 ENCOUNTER — Ambulatory Visit: Admitting: *Deleted

## 2024-02-18 DIAGNOSIS — J4540 Moderate persistent asthma, uncomplicated: Secondary | ICD-10-CM

## 2024-03-13 ENCOUNTER — Encounter: Payer: Self-pay | Admitting: *Deleted

## 2024-03-17 ENCOUNTER — Ambulatory Visit

## 2024-03-20 ENCOUNTER — Ambulatory Visit

## 2024-03-20 DIAGNOSIS — J302 Other seasonal allergic rhinitis: Secondary | ICD-10-CM

## 2024-03-27 ENCOUNTER — Other Ambulatory Visit: Payer: Self-pay

## 2024-03-31 ENCOUNTER — Ambulatory Visit: Admitting: Cardiology

## 2024-04-17 ENCOUNTER — Ambulatory Visit

## 2024-06-25 ENCOUNTER — Ambulatory Visit: Admitting: Allergy and Immunology
